# Patient Record
Sex: Male | Born: 1935 | Race: White | Hispanic: No | Marital: Married | Smoking: Never smoker
Health system: Southern US, Community
[De-identification: ages and names within clinical notes are randomized; demographics above are authoritative.]

---

## 2018-02-20 ENCOUNTER — Ambulatory Visit (INDEPENDENT_AMBULATORY_CARE_PROVIDER_SITE_OTHER): Payer: Non-veteran care | Admitting: Internal Medicine

## 2018-02-20 ENCOUNTER — Other Ambulatory Visit (INDEPENDENT_AMBULATORY_CARE_PROVIDER_SITE_OTHER): Payer: Non-veteran care

## 2018-02-20 ENCOUNTER — Encounter: Payer: Self-pay | Admitting: Internal Medicine

## 2018-02-20 VITALS — BP 112/78 | HR 106 | Ht 67.0 in | Wt 129.0 lb

## 2018-02-20 DIAGNOSIS — J849 Interstitial pulmonary disease, unspecified: Secondary | ICD-10-CM | POA: Diagnosis not present

## 2018-02-20 LAB — SEDIMENTATION RATE: Sed Rate: 57 mm/hr — ABNORMAL HIGH (ref 0–20)

## 2018-02-20 NOTE — Progress Notes (Signed)
Subjective:    Patient ID: [name], male    DOB: May 03, 1936, 82 y.o.   MRN: [id number]  [facility]   HPI  IOV 02/20/2018  Chief Complaint  Patient presents with  . Consult    Referred by Dr. Clare Charon at [facility].  Pt stated he had TB in the 60's and due to that his breathing has become worse. Pt has c/o SOB at any time, dry cough, and CP.    82 year old male.  Veteran.  Referred by [facility].  He has a background history 1964 he was stationed in [place] and diagnosed with pulmonary tuberculosis and he was then admitted to a sanatorium in the [place] where he was admitted for 3 years and treated with INH and streptomycin according to his history and had cure for tuberculosis.  Since then he is always had evidence of fibrosis in his lungs on chest x-rays according to his history and is had some baseline level of shortness of breath. But he is a non-smoker without any autoimmune disease.  Then in 2017 he says he was diagnosed with a respiratory infection.  Probably admitted.  After this he said increased shortness of breath which is been a progressive decline.  The same with the cough as well.  He says he has had 3 CT scans of the chest in 2018 and is been diagnosed with unclear lung disease and treated as infection with antibiotics and prednisone.  Finally a referral was made.  Review of the chart shows that he had a CT scan of the chest in 2019 January and this shows progressive interstitial lung disease according to the report.  Also according to the report there is a classic UIP pattern.  I do not have these films with me.  Patient also tells me in the last several weeks he has lost 30 pounds and lost a lot of muscle mass.  He is extremely frustrated by this.  American College of chest physicians interstitial lung disease questionnaire is  Symptoms of cough and shortness of breath since 1964 with a turn for the worse since 2017.  Cough does wake him up at night.  It is  a dry cough.  He gets short of breath walking 100 feet after a few minutes on level ground.  In terms of his past medical history he does have some dry mouth weight loss muscle loss dysphagia and a previous history of tuberculosis although there is no any autoimmune disease.  Personal exposure history: Has not smoked any drugs of abuse or some cigarettes of marijuana.  Family history of lung disease: Denies  Home exposure history: He does not have a humidifier or insomnia or hot tub or Jacuzzi or birds or water damage or mold.  Occupational history: Denies any farm work of Pharmacologist or Public librarian a Advertising account executive work or [employer].  Has never worked in a [place] a Teacher, music of foundry or railroad a Public librarian or smelting or Development worker, community  Other organic dust exposure history: Denies any bird exposure to feathers of insecticide of fertilizer or beryllium or cobalt or cheese or cotton or fabric etc.  Pulmonary toxicity history: Denies.  Specifically denies any amiodarone or radiation or nitrofurantoin or BCG   Walking desaturation test on 02/20/2018 185 feet x 3 laps on ROOM AIR:  did walk only 2 laps with CMA  walked very slowly and stopped at 2 laps. Did not desaturate. Rest pulse ox was 97%, final pulse ox was 91%. HR response 104/min at rest to 112/min at peak exertion. [name]  Did nto Desaturate < 88% . [name] yes did  Desaturated </= 3% points. [name] yes did get tachyardic     has no past medical history on file.   reports that  has never smoked. he has never used smokeless tobacco.   Not on File  Immunization History  Administered Date(s) Administered  . Influenza, High Dose Seasonal PF 10/10/2017    No family history on file.   Current Outpatient Medications:  .  benzonatate (TESSALON) 100 MG capsule, Take 100 mg by mouth  3 (three) times daily., Disp: , Rfl:  .  albuterol (PROAIR HFA) 108 (90 Base) MCG/ACT inhaler, Inhale into the lungs., Disp: , Rfl:  .  Dextromethorphan-guaiFENesin 10-100 MG/5ML SOLN, Take by mouth., Disp: , Rfl:  .  finasteride (PROSCAR) 5 MG tablet, Take by mouth., Disp: , Rfl:  .  losartan (COZAAR) 25 MG tablet, Take by mouth., Disp: , Rfl:  .  omeprazole (PRILOSEC) 20 MG capsule, Take by mouth., Disp: , Rfl:  .  tiotropium (SPIRIVA) 18 MCG inhalation capsule, Place into inhaler and inhale., Disp: , Rfl:     Review of Systems  Constitutional: Positive for unexpected weight change. Negative for fever.  HENT: Positive for sneezing and trouble swallowing. Negative for congestion, dental problem, ear pain, nosebleeds, postnasal drip, rhinorrhea, sinus pressure and sore throat.   Eyes: Negative for redness and itching.  Respiratory: Positive for cough, chest tightness, shortness of breath and wheezing.   Cardiovascular: Positive for palpitations. Negative for leg swelling.  Gastrointestinal: Positive for nausea. Negative for vomiting.  Genitourinary: Negative for dysuria.  Musculoskeletal: Negative for joint swelling.  Skin: Negative for rash.  Allergic/Immunologic: Negative.  Negative for environmental allergies, food allergies and immunocompromised state.  Neurological: Positive for headaches.  Hematological: Does not bruise/bleed easily.  Psychiatric/Behavioral: Negative for dysphoric mood. The patient is not nervous/anxious.        Objective:   Physical Exam  Constitutional: He is oriented to person, place, and time. No distress.  Lean cachectuc  HENT:  Head: Normocephalic and atraumatic.  Right Ear: External ear normal.  Left Ear: External ear normal.  Mouth/Throat: Oropharynx is clear and moist. No oropharyngeal exudate.  Coughs perioidically  Eyes: Conjunctivae and EOM are normal. Pupils are equal, round, and reactive to light. Right eye exhibits no discharge. Left eye  exhibits no discharge. No scleral icterus.  Neck: Normal range of motion. Neck supple. No JVD present. No tracheal deviation present. No thyromegaly present.  Cardiovascular: Normal rate, regular rhythm and intact distal pulses. Exam reveals no gallop and no friction rub.  No murmur heard. Pulmonary/Chest: Effort normal. No respiratory distress. He has wheezes. He has rales. He exhibits no tenderness.  Has LL crackle sand UL scattered ronchii  Abdominal: Soft. Bowel sounds are normal. He exhibits no distension and no mass. There is no tenderness. There is no rebound and no guarding.  Musculoskeletal: Normal range of motion. He exhibits no edema or tenderness.  Bilateral clubbing ++  Lymphadenopathy:    He has no cervical adenopathy.  Neurological: He is alert and oriented to person, place, and time. He has normal reflexes. No cranial nerve deficit. Coordination normal.  Skin: Skin is warm and dry. No rash noted. He is not diaphoretic. No erythema. No pallor.  Psychiatric:  He has a normal mood and affect. His behavior is normal. Judgment and thought content normal.  Nursing note and vitals reviewed.   Vitals:   02/20/18 1510  BP: 112/78  Pulse: (!) 106  SpO2: 94%  Weight: 129 lb (58.5 kg)  Height: _0  (1.702 m)    Estimated body mass index is 20.2 kg/m as calculated from the following:   Height as of this encounter: _1  (1.702 m).   Weight as of this encounter: 129 lb (58.5 kg).       Assessment & Plan:     ICD-10-CM   1. ILD (interstitial lung disease) ([place]) J84.9       - I am concerned you have Interstitial Lung Disease (ILD) otherwise called pulmonary fibrosis (PF)  -  There are > 100 varieties of this - most likel.y IPF based on age, Vet, male, clubbing and description of UIP in outside radiology - To narrow down possibilities and assess severity please do the following tests  - do PFT breathing test   - Pre-bd spiro and dlco only. No lung volume or bd response. No  post-bd spiro  - do overnight oxygen test on room air  - sign release and get the CD Ro of  CT chest done at [facility]; best you do it yourself  - do autoimmune panel: Serum: ESR, ANA, DS-DNA, RF, anti-CCP, ssA, ssB, scl-70, ANCA, MPO and PR-3 antibodies, Total CK,  Aldolase,  Hypersensitivity Pneumonitis Panel  Followup - next 2 - 3 weeks to see Dr Chase Caller in ILD clinic; ok to see Dr Vaughan Browner in ILD clinic as well     Dr. Brand Males, M.D., [facility].C.P Pulmonary and Critical Care Medicine Staff Physician, [place] Director - Interstitial Lung Disease  Program  Pulmonary [place] at [place], [place], [address]  Pager: [phone], If no answer or between  15:00h - 7:00h: call [phone] Telephone: [phone]

## 2018-02-20 NOTE — Patient Instructions (Signed)
ICD-10-CM   1. ILD (interstitial lung disease) ([place]) J84.9     - I am concerned you have Interstitial Lung Disease (ILD) otherwise called pulmonary fibrosis (PF)  -  There are > 100 varieties of this - To narrow down possibilities and assess severity please do the following tests  - do PFT breathing test   - Pre-bd spiro and dlco only. No lung volume or bd response. No post-bd spiro  - do overnight oxygen test on room air  - sign release and get the CD Ro of  CT chest done at [facility]; best you do it yourself  - do autoimmune panel: Serum: ESR, ANA, DS-DNA, RF, anti-CCP, ssA, ssB, scl-70, ANCA, MPO and PR-3 antibodies, Total CK,  Aldolase,  Hypersensitivity Pneumonitis Panel  Followup - next 2 - 3 weeks to see Dr Chase Caller in ILD clinic; ok to see Dr Vaughan Browner in ILD clinic as well

## 2018-02-20 NOTE — Addendum Note (Signed)
Addended by: Pilar GrammesEYNOLDS, Kwamane Whack C on: 02/20/2018 05:03 PM   Modules accepted: Orders

## 2018-02-21 LAB — CREATININE KINASE MB: CK MB: 5.8 ng/mL — AB (ref 0.3–4.0)

## 2018-02-25 LAB — HYPERSENSITIVITY PNUEMONITIS PROFILE
ASPERGILLUS FUMIGATUS: NEGATIVE
Faenia retivirgula: NEGATIVE
Pigeon Serum: POSITIVE — AB
S. VIRIDIS: NEGATIVE
T. CANDIDUS: NEGATIVE
T. VULGARIS: NEGATIVE

## 2018-02-25 LAB — ANTI-DNA ANTIBODY, DOUBLE-STRANDED: ds DNA Ab: 1 IU/mL

## 2018-02-25 LAB — CYCLIC CITRUL PEPTIDE ANTIBODY, IGG: Cyclic Citrullin Peptide Ab: <16 UNITS

## 2018-02-25 LAB — ANCA SCREEN W REFLEX TITER: ANCA Screen: NEGATIVE

## 2018-02-25 LAB — RHEUMATOID FACTOR

## 2018-02-25 LAB — SJOGRENS SYNDROME-A EXTRACTABLE NUCLEAR ANTIBODY: SSA (RO) (ENA) ANTIBODY, IGG: NEGATIVE AI

## 2018-02-25 LAB — MPO/PR-3 (ANCA) ANTIBODIES
Myeloperoxidase Abs: <1 AI
Serine Protease 3: <1 AI

## 2018-02-25 LAB — ANTI-SCLERODERMA ANTIBODY: Scleroderma (Scl-70) (ENA) Antibody, IgG: <1 AI

## 2018-02-25 LAB — ALDOLASE: ALDOLASE: 6.6 U/L (ref <=8.1)

## 2018-02-25 LAB — SJOGRENS SYNDROME-B EXTRACTABLE NUCLEAR ANTIBODY: SSB (La) (ENA) Antibody, IgG: 2.6 AI — AB

## 2018-02-25 LAB — ANA: ANA: NEGATIVE

## 2018-02-26 ENCOUNTER — Telehealth: Payer: Self-pay | Admitting: Internal Medicine

## 2018-02-26 NOTE — Progress Notes (Signed)
LMTCB x1 on preferred phone number listed for patient.  

## 2018-02-26 NOTE — Telephone Encounter (Signed)
LEt [name] DarnerJohn Morris know that autoimmune panel negative but pigeon antibody positive  1. Deifintiely needs to bring CD Rom from outside and we need to upload it 2. Did he have pigeons in past?  Dr. Kalman ShanMurali Chancellor Vanderloop, M.D., [facility]C.P Pulmonary and Critical Care Medicine Staff Physician, [facility] Director - Interstitial Lung Disease  Program  Pulmonary Fibrosis [facility] at [facility] [place], [place], [id number]  Pager: [phone], If no answer or between  15:00h - 7:00h: call [phone] Telephone: [phone]

## 2018-02-27 NOTE — Telephone Encounter (Signed)
Pt is calling back [phone] 

## 2018-02-28 NOTE — Telephone Encounter (Signed)
[facility], it's in Dr. Jane Canaryamaswamy's folder

## 2018-03-02 NOTE — Telephone Encounter (Signed)
Per Irving BurtonEmily- disc has been given to MR for review.

## 2018-03-02 NOTE — Telephone Encounter (Signed)
Spoke with the pt and notified of lab results He states that he has had pigeons in the past and also has a few now  I advised will let MR know  Note that he has ov with you with PFT's on 03/13/18

## 2018-03-02 NOTE — Telephone Encounter (Signed)
Pt aware to keep f/u Per msg below, Irving Burtonmily has given you the disc to review

## 2018-03-02 NOTE — Telephone Encounter (Signed)
lmtcb x1 to relay below results.

## 2018-03-02 NOTE — Telephone Encounter (Signed)
Let him know tha I want to discuss this in more detail at followup . This might be the reasn for fibrosis. He definitely needs to bring his CD ROM

## 2018-03-05 NOTE — Telephone Encounter (Signed)
Irving Burtonmily the disc is in red look at folder on my desk. Please keep it safe till date of visit

## 2018-03-07 NOTE — Telephone Encounter (Signed)
The disc is in MR's cubby on B-side. Will keep it there until pt's f/u visit with us.

## 2018-03-13 ENCOUNTER — Ambulatory Visit
Admission: RE | Admit: 2018-03-13 | Discharge: 2018-03-13 | Disposition: A | Discharge status: Home / Self Care | Payer: Self-pay | Source: Ambulatory Visit | Attending: Internal Medicine | Admitting: Internal Medicine

## 2018-03-13 ENCOUNTER — Ambulatory Visit (INDEPENDENT_AMBULATORY_CARE_PROVIDER_SITE_OTHER): Payer: Non-veteran care | Admitting: Internal Medicine

## 2018-03-13 ENCOUNTER — Encounter: Payer: Self-pay | Admitting: Internal Medicine

## 2018-03-13 VITALS — BP 112/68 | HR 93 | Ht 67.0 in | Wt 126.0 lb

## 2018-03-13 DIAGNOSIS — J849 Interstitial pulmonary disease, unspecified: Secondary | ICD-10-CM

## 2018-03-13 DIAGNOSIS — Z77120 Contact with and (suspected) exposure to mold (toxic): Secondary | ICD-10-CM | POA: Diagnosis not present

## 2018-03-13 LAB — PULMONARY FUNCTION TEST
DL/VA % pred: 53 %
DL/VA: 2.33 ml/min/mmHg/L
DLCO UNC % PRED: 34 %
DLCO unc: 9.79 ml/min/mmHg
FEF 25-75 Pre: 0.72 L/sec
FEF2575-%Pred-Pre: 42 %
FEV1-%Pred-Pre: 50 %
FEV1-PRE: 1.26 L
FEV1FVC-%Pred-Pre: 100 %
FEV6-%PRED-PRE: 52 %
FEV6-Pre: 1.71 L
FEV6FVC-%PRED-PRE: 106 %
FVC-%Pred-Pre: 48 %
FVC-Pre: 1.73 L
Pre FEV1/FVC ratio: 73 %
Pre FEV6/FVC Ratio: 99 %

## 2018-03-13 NOTE — Progress Notes (Signed)
PFT done today. 

## 2018-03-13 NOTE — Progress Notes (Signed)
Subjective:     Patient ID: [name], male   DOB: 16-Dec-1936, 82 y.o.   MRN: [id number]  HPI   [facility]   HPI  IOV 02/20/2018  Chief Complaint  Patient presents with  . Consult    [facility] Sel[facility].  Pt stated he had TB in the 60's and due to that his breathing has become worse. Pt has c/o SOB at any time, dry cough, and CP.    82 year old male.  Veteran.  [facility].  He has a background history 1964 he was stationed in [place] and diagnosed with pulmonary tuberculosis and he was then admitted [place] sanatorium [place] the [place] where he was admitted for 3 years and treated with INH and streptomycin according to his history and had cure for tuberculosis.  Since then he is always had evidence of fibrosis in his lungs on chest x-rays according to his history and is had some baseline level of shortness of breath. But he is a non-smoker without any autoimmune disease.  Then in 2017 he says he was diagnosed with a respiratory infection.  Probably admitted.  After this he said increased shortness of breath which is been a progressive decline.  The same with the cough as well.  He says he has had 3 CT scans of the chest in 2018 and is been diagnosed with unclear lung disease and treated as infection with antibiotics and prednisone.  Finally a referral was made.  Review of the chart shows that he had a CT scan of the chest in 2019 January and this shows progressive interstitial lung disease according to the report.  Also according to the report there is a classic UIP pattern.  I do not have these films with me.  Patient also tells me in the last several weeks he has lost 30 pounds and lost a lot of muscle mass.  He is extremely frustrated by this.  American College of chest physicians interstitial lung disease questionnaire is  Symptoms of cough and shortness of breath since 1964 with a turn for the worse since 2017.  Cough does wake him up at night.   It is a dry cough.  He gets short of breath walking 100 feet after a few minutes on level ground.  In terms of his past medical history he does have some dry mouth weight loss muscle loss dysphagia and a previous history of tuberculosis although there is no any autoimmune disease.  Personal exposure history: Has not smoked any drugs of abuse or some cigarettes of marijuana.  Family history of lung disease: Denies  Home exposure history: He does not have a humidifier or insomnia or hot tub or Jacuzzi or birds or water damage or mold.  Occupational history: Denies any farm work of Financial plannerpainting or sandblasting or pipefitting or automotive mechanic or welding or insulation or Transport plannerVineyard worker a Ambulance personcarpenter or laboratory work or Atmos Energylongshoreman.  Has never worked in a minor quarry of [employer]ulp mill a Theatre managerbakery of plastic factory of foundry or railroad a Tax inspectorpaper mill or smelting or Chemical engineertile construction  Other organic dust exposure history: Denies any bird exposure to feathers of insecticide of fertilizer or beryllium or cobalt or cheese or cotton or fabric etc.  Pulmonary toxicity history: Denies.  Specifically denies any amiodarone or radiation or nitrofurantoin or BCG     Walking desaturation test on 02/20/2018 185 feet x 3 laps on ROOM AIR:  did walk only  2 laps with CMA walked very slowly and stopped at 2 laps. Did not desaturate. Rest pulse ox was 97%, final pulse ox was 91%. HR response 104/min at rest to 112/min at peak exertion. [name]  Did nto Desaturate < 88% . [name] yes did  Desaturated </= 3% points. [name] yes did get tachyardic   OV 03/13/2018  Chief Complaint  Patient presents with  . Follow-up    PFT done today.  Pt states his breathing is about the same since last visit, becoming SOB very easily, has a dry cough, and has pain from right side of chest into his back and radiating down right arm.     FU to discuss teest results  Hx: now admits to mold expoisure at home. Lives  in trailer x 10 years. Has water seepage. Has mold in different room and on clothes and shotes. Got expiosed to piegon once in fall 2018 for 2 weeks when he went to a[place] to reduce bird exposure (work related). Denies other bird expoisreu. Very worried about weight loss. KBIL shows significant wrry ad symptoms - all level 1 - 2of 7 point scale    Results for [name] (MRN [id number]) as of 03/13/2018 13:36  Ref. Range 03/13/2018 12:21  FVC-Pre Latest Units: L 1.73  FVC-%Pred-Pre Latest Units: % 48  FEV1-Pre Latest Units: L 1.26  FEV1-%Pred-Pre Latest Units: % 50  Pre FEV1/FVC ratio Latest Units: % 73  FEV1FVC-%Pred-Pre Latest Units: % 100  Results for [name] (MRN [id number]) as of 03/13/2018 13:36  Ref. Range 03/13/2018 12:21  DLCO unc Latest Units: ml/min/mmHg 9.79  DLCO unc % pred Latest Units: % 34   Results for [name] (MRN [id number]) as of 03/13/2018 13:36  Ref. Range 02/20/2018 17:01 02/20/2018 17:05  CK-MB Latest Ref Range: 0.3 - 4.0 ng/mL  5.8 (H)  Aldolase Latest Ref Range: < OR = 8.1 U/L 6.6   Sed Rate Latest Ref Range: 0 - 20 mm/hr 57 (H)   ASPERGILLUS FUMIGATUS Latest Ref Range: NEGATIVE  NEGATIVE   Pigeon Serum Latest Ref Range: NEGATIVE  POSITIVE (A)   Anit Nuclear Antibody(ANA) Latest Ref Range: NEGATIVE  NEGATIVE   Cyclic Citrullin Peptide Ab Latest Units: UNITS <16   ds DNA Ab Latest Units: IU/mL 1   Myeloperoxidase Abs Latest Units: AI <1.0   Serine Protease 3 Latest Units: AI <1.0   RA Latex Turbid. Latest Ref Range: <14 IU/mL <14   SSA (Ro) (ENA) Antibody, IgG Latest Ref Range: <1.0 NEG AI <1.0 NEG   SSB (La) (ENA) Antibody, IgG Latest Ref Range: <1.0 NEG AI 2.6 POS (A)   Scleroderma (Scl-70) (ENA) Antibody, IgG Latest Ref Range: <1.0 NEG AI <1.0 NEG        has no past medical history on file.   reports that he has never smoked. He has never used smokeless tobacco.  No past surgical history on file.  Not on File  Immunization History   Administered Date(s) Administered  . Influenza, High Dose Seasonal PF 10/10/2017    No family history on file.   Current Outpatient Medications:  .  albuterol (PROAIR HFA) 108 (90 Base) MCG/ACT inhaler, Inhale into the lungs., Disp: , Rfl:  .  benzonatate (TESSALON) 100 MG capsule, Take 100 mg by mouth 3 (three) times daily., Disp: , Rfl:  .  Dextromethorphan-guaiFENesin 10-100 MG/5ML SOLN, Take by mouth., Disp: , Rfl:  .  finasteride (PROSCAR) 5 MG tablet, Take by mouth., Disp: ,  Rfl:  .  losartan (COZAAR) 25 MG tablet, Take by mouth., Disp: , Rfl:  .  omeprazole (PRILOSEC) 20 MG capsule, Take by mouth., Disp: , Rfl:  .  tiotropium (SPIRIVA) 18 MCG inhalation capsule, Place into inhaler and inhale., Disp: , Rfl:    Review of Systems     Objective:   Physical Exam Vitals:   03/13/18 1322  BP: 112/68  Pulse: 93  SpO2: 92%  Weight: 126 lb (57.2 kg)  Height: 5\' 7"  (1.702 m)   Estimated body mass index is 19.73 kg/m as calculated from the following:   Height as of this encounter: 5\' 7"  (1.702 m).   Weight as of this encounter: 126 lb (57.2 kg).     Assessment:       ICD-10-CM   1. ILD (interstitial lung disease) (HCC) J84.9 CT OUTSIDE FILMS CHEST  2. 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 Benefits of diagnosis but limitations of non-diagnosis also explained. Patient  verbalized understanding and wished to proceed.    Followup - 2 weeks after bronch at ILD clinic  - if bronch  + thoracic rad CT read is still indeterminate then will get rheum aopinion and then if that too is indeterminate, consider surgical lung bx v clinical dx (lean BMI might be risk for bx)    > 50% of this > 25 min visit spent in face to face counseling or coordination of care

## 2018-03-13 NOTE — Patient Instructions (Addendum)
ICD-10-CM   1. ILD (interstitial lung disease) (HCC) J84.9 CT OUTSIDE FILMS CHEST  2. 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

## 2018-03-14 ENCOUNTER — Ambulatory Visit
Admission: RE | Admit: 2018-03-14 | Discharge: 2018-03-14 | Disposition: A | Discharge status: Home / Self Care | Payer: Self-pay | Source: Ambulatory Visit | Attending: Internal Medicine | Admitting: Internal Medicine

## 2018-03-14 ENCOUNTER — Other Ambulatory Visit: Payer: Self-pay | Admitting: *Deleted

## 2018-03-14 DIAGNOSIS — J849 Interstitial pulmonary disease, unspecified: Secondary | ICD-10-CM

## 2018-03-14 NOTE — Progress Notes (Signed)
Received a message from [place] at [facility] who stated she had received the disc of pt's images we wanted added into our system.  Per Misty StanleyStacey, she needed 5 outside CT chest orders and 1 outside cxr order.  Orders placed and [facility].  Nothing further needed at this time.

## 2018-03-15 ENCOUNTER — Telehealth: Payer: Self-pay | Admitting: Internal Medicine

## 2018-03-15 NOTE — Telephone Encounter (Signed)
Called and spoke with [place] [facility]. I have faxed office notes as well as the PFT results. Nothing further needed.

## 2018-03-21 ENCOUNTER — Telehealth: Payer: Self-pay | Admitting: Internal Medicine

## 2018-03-21 ENCOUNTER — Encounter (HOSPITAL_COMMUNITY): Admission: RE | Payer: Self-pay | Source: Ambulatory Visit

## 2018-03-21 ENCOUNTER — Inpatient Hospital Stay (HOSPITAL_COMMUNITY)
Admission: RE | Admit: 2018-03-21 | Discharge: 2018-03-21 | Disposition: A | Discharge status: Home / Self Care | Payer: No Typology Code available for payment source | Source: Ambulatory Visit

## 2018-03-21 ENCOUNTER — Ambulatory Visit (HOSPITAL_COMMUNITY)
Admission: RE | Admit: 2018-03-21 | Payer: No Typology Code available for payment source | Source: Ambulatory Visit | Admitting: Internal Medicine

## 2018-03-21 SURGERY — VIDEO BRONCHOSCOPY WITHOUT FLUORO
Anesthesia: Moderate Sedation | Laterality: Bilateral

## 2018-03-21 NOTE — Telephone Encounter (Signed)
Called and spoke to patient to see if he would be able to reschedule the bronch for dates MR had suggested. Patient stated he won't have car fixed by next week and can't do the week of 4/22 either because of his wife's prior obligations. May need to push back to May.   Routing to MR as [employer]

## 2018-03-21 NOTE — Telephone Encounter (Signed)
[facility] 2pm onwards. I can also do tuesday4/9/19 at [place] after 2pm but I less prefer this date but both are options. If not will have to be week of 04/09/18

## 2018-03-21 NOTE — Telephone Encounter (Signed)
Called and spoke to patient. Patient stated [place] in [place] and his car has broken down. He stated he has a secondary vehicle but that it's stick shift and that his wife can't drive it. He called and asked [facility] if he can drive home after procedure and he was told he would need a ride. Patient stated he doesn't have transportation and now has a headache and feeling a little nauseous. Patient said he is sorry but that he needs to reschedule.   Routing to MR.

## 2018-04-10 ENCOUNTER — Ambulatory Visit: Payer: No Typology Code available for payment source | Admitting: Internal Medicine

## 2018-04-10 NOTE — Telephone Encounter (Signed)
Irving Burtonmily to coordinate - choose a date week of may 6 and may 13 when I do not have all day or PM clinic and should be 2pm [place] (avoid Monday and Friday to extent possible)

## 2018-04-11 NOTE — Telephone Encounter (Signed)
Called pt to see if he was ready to reschedule the bronch and pt stated to me his breathing has become worse and at this time does not want to reschedule the bronch due to being afraid he would not wake up if put under anesthesia.  Pt stated to me he is currently wearing O2 at night and states he is not wearing it during the day but the DME who brought his O2 to him stated that he needed to come to the office for an appt to see if he might need to wear O2 during the day due to his breathing becoming worse.  Scheduled an appt for pt to see MR Friday, 4/26 at 12pm.  Nothing further needed at this time.

## 2018-04-13 ENCOUNTER — Encounter: Payer: Self-pay | Admitting: Internal Medicine

## 2018-04-13 ENCOUNTER — Ambulatory Visit (INDEPENDENT_AMBULATORY_CARE_PROVIDER_SITE_OTHER): Payer: Non-veteran care | Admitting: Internal Medicine

## 2018-04-13 VITALS — BP 120/80 | HR 99 | Ht 67.0 in | Wt 123.6 lb

## 2018-04-13 DIAGNOSIS — J9611 Chronic respiratory failure with hypoxia: Secondary | ICD-10-CM | POA: Diagnosis not present

## 2018-04-13 DIAGNOSIS — J849 Interstitial pulmonary disease, unspecified: Secondary | ICD-10-CM

## 2018-04-13 MED ORDER — PREDNISONE 10 MG PO TABS: ORAL_TABLET | ORAL | 0 refills | Status: DC

## 2018-04-13 NOTE — Progress Notes (Signed)
Subjective:     Patient ID: [name], male   DOB: 03/08/1936, 82 y.o.   MRN: [id number]  HPI   [facility]   [facility]PI  IOV 02/20/2018  Chief Complaint  Patient presents with  . Consult    Referred by [facility] Selinda Morris at [facility].  Pt stated he had TB in the 60's and due to that his breathing has become worse. Pt has c/o SOB at any time, dry cough, and CP.    82 year old male.  Veteran.  Referred by [facility].  He has a background history 1964 he was stationed in [place] and diagnosed with pulmonary tuberculosis and he was then admitted [place] san[place] the [place] where he was admitted for 3 years and treated with INH and streptomycin according to his history and had cure for tuberculosis.  Since then he is always had evidence of fibrosis in his lungs on chest x-rays according to his history and is had some baseline level of shortness of breath. But he is a non-smoker without any autoimmune disease.  Then in 2017 he says he was diagnosed with a respiratory infection.  Probably admitted.  After this he said increased shortness of breath which is been a progressive decline.  The same with the cough as well.  He says he has had 3 CT scans of the chest in 2018 and is been diagnosed with unclear lung disease and treated as infection with antibiotics and prednisone.  Finally a referral was made.  Review of the chart shows that he had a CT scan of the chest in 2019 January and this shows progressive interstitial lung disease according to the report.  Also according to the report there is a classic UIP pattern.  I do not have these films with me.  Patient also tells me in the last several weeks he has lost 30 pounds and lost a lot of muscle mass.  He is extremely frustrated by this.  American College of chest physicians interstitial lung disease questionnaire is  Symptoms of cough and shortness of breath since 1964 with a turn for the worse since 2017.  Cough does wake him up at night.   It is a dry cough.  He gets short of breath walking 100 feet after a few minutes on level ground.  In terms of his past medical history he does have some dry mouth weight loss muscle loss dysphagia and a previous history of tuberculosis although there is no any autoimmune disease.  Personal exposure history: Has not smoked any drugs of abuse or some cigarettes of marijuana.  Family history of lung disease: Denies  Home exposure history: He does not have a humidifier or insomnia or hot tub or Jacuzzi or birds or water damage or mold.  Occupational history: Denies any farm work of Financial plannerpainting or sandblasting or pipefitting or automotive mechanic or welding or insulation or Transport plannerVineyard worker a Ambulance personcarpenter or laboratory work or Atmos Energylongshoreman.  Has never worked in a minor quarry of [employer] mill a Theatre managerbakery of plastic factory of foundry or railroad a Tax inspectorpaper mill or smelting or Chemical engineertile construction  Other organic dust exposure history: Denies any bird exposure to feathers of insecticide of fertilizer or beryllium or cobalt or cheese or cotton or fabric etc.  Pulmonary toxicity history: Denies.  Specifically denies any amiodarone or radiation or nitrofurantoin or BCG     Walking desaturation test on 02/20/2018 185 feet x 3 laps on ROOM AIR:  did walk only  2 laps with CMA walked very slowly and stopped at 2 laps. Did not desaturate. Rest pulse ox was 97%, final pulse ox was 91%. HR response 104/min at rest to 112/min at peak exertion. [name]  Did nto Desaturate < 88% . [name] yes did  Desaturated </= 3% points. [name] yes did get tachyardic   OV 03/13/2018  Chief Complaint  Patient presents with  . Follow-up    PFT done today.  Pt states his breathing is about the same since last visit, becoming SOB very easily, has a dry cough, and has pain from right side of chest into his back and radiating down right arm.     FU to discuss teest results  Hx: now admits to mold expoisure at home. Lives  in trailer x 10 years. Has water seepage. Has mold in different room and on clothes and shotes. Got expiosed to piegon once in fall 2018 for 2 weeks when he [place] to reduce bird exposure (work related). Denies other bird expoisreu. Very worried about weight loss. KBIL shows significant wrry ad symptoms - all level 1 - 2of 7 point scale       Results for [name], [name] (MRN [id number]) as of 03/13/2018 13:36  Ref. Range 03/13/2018 12:21  FVC-Pre Latest Units: L 1.73  FVC-%Pred-Pre Latest Units: % 48  FEV1-Pre Latest Units: L 1.26  FEV1-%Pred-Pre Latest Units: % 50  Pre FEV1/FVC ratio Latest Units: % 73  FEV1FVC-%Pred-Pre Latest Units: % 100  Results for [name], [name] (MRN [id number]) as of 03/13/2018 13:36  Ref. Range 03/13/2018 12:21  DLCO unc Latest Units: ml/min/mmHg 9.79  DLCO unc % pred Latest Units: % 34   Results for [name], [name] (MRN [id number]) as of 03/13/2018 13:36  Ref. Range 02/20/2018 17:01 02/20/2018 17:05  CK-MB Latest Ref Range: 0.3 - 4.0 ng/mL  5.8 (H)  Aldolase Latest Ref Range: < OR = 8.1 U/L 6.6   Sed Rate Latest Ref Range: 0 - 20 mm/hr 57 (H)   ASPERGILLUS FUMIGATUS Latest Ref Range: NEGATIVE  NEGATIVE   Pigeon Serum Latest Ref Range: NEGATIVE  POSITIVE (A)   Anit Nuclear Antibody(ANA) Latest Ref Range: NEGATIVE  NEGATIVE   Cyclic Citrullin Peptide Ab Latest Units: UNITS <16   ds DNA Ab Latest Units: IU/mL 1   Myeloperoxidase Abs Latest Units: AI <1.0   Serine Protease 3 Latest Units: AI <1.0   RA Latex Turbid. Latest Ref Range: <14 IU/mL <14   SSA (Ro) (ENA) Antibody, IgG Latest Ref Range: <1.0 NEG AI <1.0 NEG   SSB (La) (ENA) Antibody, IgG Latest Ref Range: <1.0 NEG AI 2.6 POS (A)   Scleroderma (Scl-70) (ENA) Antibody, IgG Latest Ref Range: <1.0 NEG AI <1.0 NEG      OV 04/13/2018  Chief Complaint  Patient presents with  . Acute Visit    Pt states he has had worsening breathing and has a burning sensation in rib cage on right side and has pain in abdomen  muscles. Pt also has a dry cough.   Follow-up interstitial lung disease/pulmonary fibrosis differential diagnosis of UIP/IPF versus chronic fibrotichypersensitivity pneumonitis: Mold exposure Was supposed to do a bronchoalveolar lavage to distant vision between the above etiologies but his car broke down and he could not do it but then he made this acute visit saying that the last 2 weeks his increasing shortness of breath and cough without any sputum. There is no orthopnea paroxysmal nocturnal dyspnea or edema or fever. He  is also lost weight and see last saw me. Apparently he has lost 20 or 30 pounds. It seems that is progressive respirator symptoms are correlating with this weight loss. He feels prednisone might help him. Today when we walked him he desaturated and this is a change from before.   Walking desaturation test on 04/13/2018 185 feet x 3 laps on ROOM AIR:  did YES  desaturate. Rest pulse ox was 97%, final pulse ox was 87%. HR response 99/min at rest to 112/min at peak exertion. [name]  Yes did Desaturate < 88% . [name] yes did  Desaturated </= 3% points. [name] yes get tachyardic. Neeeded 2L [place] to correct       has no past medical history on file.   reports that he has never smoked. He has never used smokeless tobacco.  No past surgical history on file.  No Known Allergies  Immunization History  Administered Date(s) Administered  . Influenza, High Dose Seasonal PF 10/10/2017    No family history on file.   Current Outpatient Medications:  .  albuterol (PROAIR HFA) 108 (90 Base) MCG/ACT inhaler, Inhale 2 puffs into the lungs every 4 (four) hours as needed (FOR WHEEZING/SHORTNESS OF BREATH). , Disp: , Rfl:  .  albuterol (PROVENTIL) (2.5 MG/3ML) 0.083% nebulizer solution, Take 2.5 mg by nebulization every 6 (six) hours as needed for wheezing or shortness of breath., Disp: , Rfl:  .  benzonatate (TESSALON) 100 MG capsule, Take 100 mg by mouth 3  (three) times daily., Disp: , Rfl:  .  Bioflavonoid Products (VITAMIN C) CHEW, Chew 2 tablets by mouth daily., Disp: , Rfl:  .  finasteride (PROSCAR) 5 MG tablet, Take 5 mg by mouth daily. , Disp: , Rfl:  .  guaifenesin (HUMIBID E) 400 MG TABS tablet, Take 400 mg by mouth 3 (three) times daily., Disp: , Rfl:  .  ibuprofen (ADVIL,MOTRIN) 200 MG tablet, Take 400 mg by mouth every 8 (eight) hours as needed (for pain.)., Disp: , Rfl:  .  losartan (COZAAR) 100 MG tablet, Take 50 mg by mouth daily., Disp: , Rfl:  .  Methylsulfonylmethane (MSM) 1000 MG CAPS, Take 1,000 mg by mouth daily., Disp: , Rfl:  .  Multiple Vitamin (MULTIVITAMIN WITH MINERALS) TABS tablet, Take 1 tablet by mouth daily. CELLULAR ENERGY IMMUNE SUPPORT & MUSCULAR SUPPORT MULTIVITAMIN, Disp: , Rfl:  .  omeprazole (PRILOSEC) 20 MG capsule, Take 20 mg by mouth 3 (three) times daily before meals. , Disp: , Rfl:  .  tiotropium (SPIRIVA) 18 MCG inhalation capsule, Place 18 mcg into inhaler and inhale 2 (two) times daily as needed (SCHEDULED EACH MORNING). , Disp: , Rfl:     Review of Systems     Objective:   Physical Exam Today's Vitals   04/13/18 1139  BP: 120/80  Pulse: 99  SpO2: 97%  Weight: 123 lb 9.6 oz (56.1 kg)  Height: 5\' 7"  (1.702 m)    Estimated body mass index is 19.36 kg/m as calculated from the following:   Height as of this encounter: 5\' 7"  (1.702 m).   Weight as of this encounter: 123 lb 9.6 oz (56.1 kg).     Assessment:       ICD-10-CM   1. ILD (interstitial lung disease) (HCC) J84.9 Ambulatory Referral for DME  2. Chronic hypoxemic respiratory failure (HCC) J96.11        Plan:      Progressive fibrosis of lung This is either chronic HP  or IPF progressive Getting worse fast   Plan - try prednisone for flare v chronic HP mamangement -   -he Is too frail and lsot lot of weight to tolerate Ricki Rodriguez Durel Salts  - Please take Take prednisone 60mg  daily x 3 days, then 40mg  once daily x 3 days, then 30mg   once daily x 3 days, then 20mg  once daily x 3 days, then prednisone 10mg  once daily  x 3 days and then 5mg  daily to continue - might need higher doses depending on his feedback at folowup - continue o2 at night  - start o2 2LNC with exertion  Followup  - 30 min time slot at followup - next 2 weeks with Dr Marchelle Gearing or an APP  - walk test on room air (not ) at followup  - will need to discuss goals of care at followup   Dr. Kalman Shan, M.D., [facility].C.P Pulmonary and Critical Care Medicine Staff Physician, [facility] Center Director - Interstitial Lung Disease  Program  Pulmonary Fibrosis [facility] at [facility] [place], [place], [address]  Pager: [phone], If no answer or between  15:00h - 7:00h: call [phone] Telephone: [phone]

## 2018-04-13 NOTE — Patient Instructions (Signed)
ICD-10-CM   1. ILD (interstitial lung disease) (HCC) J84.9 Ambulatory Referral for DME  2. Chronic hypoxemic respiratory failure (HCC) J96.11      Progressive fibrosis of lung This is either chronic HP or IPF progressive Getting worse fast  Plan  - Please take Take prednisone 60mg  daily x 3 days, then 40mg  once daily x 3 days, then 30mg  once daily x 3 days, then 20mg  once daily x 3 days, then prednisone 10mg  once daily  x 3 days and then 5mg  daily to continue - continue o2 at night  - start o2 2LNC with exertion  Followup  - 30 min time slot at followup - next 2 weeks with Dr Marchelle Gearingamaswamy or an APP  - walk test on room air (not 6mwd) at followup

## 2018-04-26 ENCOUNTER — Telehealth: Payer: Self-pay | Admitting: Internal Medicine

## 2018-04-26 NOTE — Telephone Encounter (Signed)
Spoke with pt, he states he cannot send Rx through the regular pharmacy. I [facility]. Will hold in triage.

## 2018-04-26 NOTE — Telephone Encounter (Signed)
Spoke with pt, he states he never received the prednisone Rx for Prednisone. All Rx's have to go through the [place]. MR can we send this Rx in? I wanted to make sure because it has been over a month ago. Please advise.    Patient Instructions by Kalman Shan, MD at 04/13/2018 12:00 PM  Author: Kalman Shan, MD Author Type: Physician Filed: 04/13/2018 12:32 PM  Note Status: Signed Cosign: Cosign Not Required Encounter Date: 04/13/2018  Editor: Kalman Shan, MD (Physician)      ICD-10-CM   1. ILD (interstitial lung disease) (HCC) J84.9 Ambulatory Referral for DME  2. Chronic hypoxemic respiratory failure (HCC) J96.11      Progressive fibrosis of lung This is either chronic HP or IPF progressive Getting worse fast  Plan  - Please take Take prednisone  daily x 3 days, then  once daily x 3 days, then  once daily x 3 days, then  once daily x 3 days, then prednisone  once daily  x 3 days and then  daily to continue - continue o2 at night  - start o2 2LNC with exertion  Followup  - 30 min time slot at followup - next 2 weeks with Dr Marchelle Gearing or an APP             - walk test on room air (not ) at followup

## 2018-04-26 NOTE — Telephone Encounter (Signed)
Yeah just send it into [facility] and he can pick it there. Is cheap. And no need to delay Rx   Dr. Kalman Shan, M.D., [facility].C.P Pulmonary and Critical Care Medicine Staff Physician, [facility] Center Director - Interstitial Lung Disease  Program  Pulmonary Fibrosis [facility] Network at [facility] [place], [place], [address]  Pager: [phone], If no answer or between  15:00h - 7:00h: call [phone] Telephone: [phone]

## 2018-04-27 MED ORDER — PREDNISONE 10 MG PO TABS: ORAL_TABLET | ORAL | 0 refills | Status: DC

## 2018-04-27 NOTE — Telephone Encounter (Signed)
Called the [facility] customer service [phone] and received the fax number to [place], [place] at [phone]/ Called pt to let him know and nothing further is needed.

## 2018-05-01 ENCOUNTER — Telehealth: Payer: Self-pay | Admitting: Internal Medicine

## 2018-05-01 ENCOUNTER — Ambulatory Visit (INDEPENDENT_AMBULATORY_CARE_PROVIDER_SITE_OTHER): Payer: Non-veteran care | Admitting: Internal Medicine

## 2018-05-01 ENCOUNTER — Encounter: Payer: Self-pay | Admitting: Internal Medicine

## 2018-05-01 VITALS — BP 116/74 | HR 109 | Ht 67.0 in | Wt 124.8 lb

## 2018-05-01 DIAGNOSIS — Z77120 Contact with and (suspected) exposure to mold (toxic): Secondary | ICD-10-CM | POA: Diagnosis not present

## 2018-05-01 DIAGNOSIS — J679 Hypersensitivity pneumonitis due to unspecified organic dust: Secondary | ICD-10-CM

## 2018-05-01 DIAGNOSIS — J849 Interstitial pulmonary disease, unspecified: Secondary | ICD-10-CM

## 2018-05-01 MED ORDER — PREDNISONE 10 MG PO TABS: 10.0000 mg | ORAL_TABLET | Freq: Every day | ORAL | 0 refills | Status: DC

## 2018-05-01 NOTE — Progress Notes (Signed)
Subjective:     Patient ID: [name], male   DOB: June 05, 1936, 82 y.o.   MRN: [id number]  HPI   [facility]   [facility]PI  IOV 02/20/2018  Chief Complaint  Patient presents with  . Consult    Referred by Dr. Selinda Morris at [facility].  Pt stated he had TB in the 60's and due to that his breathing has become worse. Pt has c/o SOB at any time, dry cough, and CP.    82 year old male.  Veteran.  Referred by [facility].  He has a background history 1964 he was stationed in [place] and diagnosed with pulmonary tuberculosis and he was then admitted to a sanatorium in the [place] where he was admitted for 3 years and treated with INH and streptomycin according to his history and had cure for tuberculosis.  Since then he is always had evidence of fibrosis in his lungs on chest x-rays according to his history and is had some baseline level of shortness of breath. But he is a non-smoker without any autoimmune disease.  Then in 2017 he says he was diagnosed with a respiratory infection.  Probably admitted.  After this he said increased shortness of breath which is been a progressive decline.  The same with the cough as well.  He says he has had 3 CT scans of the chest in 2018 and is been diagnosed with unclear lung disease and treated as infection with antibiotics and prednisone.  Finally a referral was made.  Review of the chart shows that he had a CT scan of the chest in 2019 January and this shows progressive interstitial lung disease according to the report.  Also according to the report there is a classic UIP pattern.  I do not have these films with me.  Patient also tells me in the last several weeks he has lost 30 pounds and lost a lot of muscle mass.  He is extremely frustrated by this.  American College of chest physicians interstitial lung disease questionnaire is  Symptoms of cough and shortness of breath since 1964 with a turn for the worse since 2017.  Cough does wake him up at night.   It is a dry cough.  He gets short of breath walking 100 feet after a few minutes on level ground.  In terms of his past medical history he does have some dry mouth weight loss muscle loss dysphagia and a previous history of tuberculosis although there is no any autoimmune disease.  Personal exposure history: Has not smoked any drugs of abuse or some cigarettes of marijuana.  Family history of lung disease: Denies  Home exposure history: He does not have a humidifier or insomnia or hot tub or Jacuzzi or birds or water damage or mold.  Occupational history: Denies any farm work of Financial planner or Transport planner a Ambulance person work or [employer].  Has never worked in a minor quarry of [employer] a Theatre manager of foundry or railroad a Tax inspector or smelting or Chemical engineer  Other organic dust exposure history: Denies any bird exposure to feathers of insecticide of fertilizer or beryllium or cobalt or cheese or cotton or fabric etc.  Pulmonary toxicity history: Denies.  Specifically denies any amiodarone or radiation or nitrofurantoin or BCG     Walking desaturation test on 02/20/2018 185 feet x 3 laps on ROOM AIR:  did walk only  2 laps with CMA walked very slowly and stopped at 2 laps. Did not desaturate. Rest pulse ox was 97%, final pulse ox was 91%. HR response 104/min at rest to 112/min at peak exertion. [name]  Did nto Desaturate < 88% . [name] yes did  Desaturated </= 3% points. [name] yes did get tachyardic   OV 03/13/2018  Chief Complaint  Patient presents with  . Follow-up    PFT done today.  Pt states his breathing is about the same since last visit, becoming SOB very easily, has a dry cough, and has pain from right side of chest into his back and radiating down right arm.     FU to discuss teest results  Hx: now admits to mold expoisure at home. Lives  in trailer x 10 years. Has water seepage. Has mold in different room and on clothes and shotes. Got expiosed to piegon once in fall 2018 for 2 weeks when he went to [place] to reduce bird exposure (work related). Denies other bird expoisreu. Very worried about weight loss. KBIL shows significant wrry ad symptoms - all level 1 - 2of 7 point scale       Results for [name] (MRN [id number]) as of 03/13/2018 13:36  Ref. Range 03/13/2018 12:21  FVC-Pre Latest Units: L 1.73  FVC-%Pred-Pre Latest Units: % 48  FEV1-Pre Latest Units: L 1.26  FEV1-%Pred-Pre Latest Units: % 50  Pre FEV1/FVC ratio Latest Units: % 73  FEV1FVC-%Pred-Pre Latest Units: % 100  Results for [name] (MRN [id number]) as of 03/13/2018 13:36  Ref. Range 03/13/2018 12:21  DLCO unc Latest Units: ml/min/mmHg 9.79  DLCO unc % pred Latest Units: % 34   Results for [name] (MRN [id number]) as of 03/13/2018 13:36  Ref. Range 02/20/2018 17:01 02/20/2018 17:05  CK-MB Latest Ref Range: 0.3 - 4.0 ng/mL  5.8 (H)  Aldolase Latest Ref Range: < OR = 8.1 U/L 6.6   Sed Rate Latest Ref Range: 0 - 20 mm/hr 57 (H)   ASPERGILLUS FUMIGATUS Latest Ref Range: NEGATIVE  NEGATIVE   Pigeon Serum Latest Ref Range: NEGATIVE  POSITIVE (A)   Anit Nuclear Antibody(ANA) Latest Ref Range: NEGATIVE  NEGATIVE   Cyclic Citrullin Peptide Ab Latest Units: UNITS <16   ds DNA Ab Latest Units: IU/mL 1   Myeloperoxidase Abs Latest Units: AI <1.0   Serine Protease 3 Latest Units: AI <1.0   RA Latex Turbid. Latest Ref Range: <14 IU/mL <14   SSA (Ro) (ENA) Antibody, IgG Latest Ref Range: <1.0 NEG AI <1.0 NEG   SSB (La) (ENA) Antibody, IgG Latest Ref Range: <1.0 NEG AI 2.6 POS (A)   Scleroderma (Scl-70) (ENA) Antibody, IgG Latest Ref Range: <1.0 NEG AI <1.0 NEG      OV 04/13/2018  Chief Complaint  Patient presents with  . Acute Visit    Pt states he has had worsening breathing and has a burning sensation in rib cage on right side and has pain in abdomen  muscles. Pt also has a dry cough.   Follow-up interstitial lung disease/pulmonary fibrosis differential diagnosis of UIP/IPF versus chronic fibrotichypersensitivity pneumonitis: Mold exposure He was supposed to do a bronchoalveolar lavaget ddx n between the above etiologies but his car broke down and he could not do it but then he made this acute visit saying that the last 2 weeks his increasing shortness of breath and cough without any sputum. There is no orthopnea paroxysmal nocturnal dyspnea or edema or fever. He  is also lost weight and see last saw me. Apparently he has lost 20 or 30 pounds. It seems that is progressive respirator symptoms are correlating with this weight loss. He feels prednisone might help him. Today when we walked him he desaturated and this is a change from before.   Walking desaturation test on 04/13/2018 185 feet x 3 laps on ROOM AIR:  did YES  desaturate. Rest pulse ox was 97%, final pulse ox was 87%. HR response 99/min at rest to 112/min at peak exertion. [name]  Yes did Desaturate < 88% . [name] yes did  Desaturated </= 3% points. [name] yes get tachyardic. Neeeded 2L [place] to correct    OV 05/01/2018  Chief Complaint  Patient presents with  . Follow-up    Pt states he is currently on prednisone and that has helped with his breathing. Pt is on O2 during the day and also at night.    Follow-up interstitial lung disease with differential diagnosis of IPF versus chronic fibrotic hypersensitivity pneumonitis with significant mold exposure  This is a quick follow-up to see how he is doing with prednisone.  However because he is a [place] patient and we did not realize that his prescriptions need to go to the [place] and there was a delay with this he did not get his prednisone till 2 days ago.  He is currently has finished 2 days of 60 mg prednisone and is feeling to feel significantly better.  In fact walking desaturation test today even though he desaturated  to was 89% he did not go as low as previously.  Also his weight loss is stabilized he is now down at 117 pounds and stable there.  [facility] till [facility], 2018 to be seen in the ILD clinic or outside the [place] [facility].  He is asking if he needs extended follow-up in the form of fibrosis program which I believe he definitely needs given the severity of his lung disease his weight loss and the specialist care this needs.  Simple office walk 185 feet x  3 laps goal with forehead probe 04/13/18 05/01/2018   O2 used Room air Room air  Number laps completed 3 3  Comments about pace x Good pace  Resting Pulse Ox/HR 97% and 99/min 96%/104  Final Pulse Ox/HR 87% and 112/min 89%/116  Desaturated </= 88% yes no  Desaturated <= 3% points yes yes  Got Tachycardic >/= 90/min yes yes  Symptoms at end of test dyspnea Mild dyspnea  Miscellaneous comments Needed 2LNC to correct Mild dyspnea        has no past medical history on file.   reports that he has never smoked. He has never used smokeless tobacco.  No past surgical history on file.  Allergies  Allergen Reactions  . Latex Rash    Immunization History  Administered Date(s) Administered  . Influenza, High Dose Seasonal PF 10/10/2017    No family history on file.   Current Outpatient Medications:  .  albuterol (PROAIR HFA) 108 (90 Base) MCG/ACT inhaler, Inhale 2 puffs into the lungs every 4 (four) hours as needed (FOR WHEEZING/SHORTNESS OF BREATH). , Disp: , Rfl:  .  albuterol (PROVENTIL) (2.5 MG/3ML) 0.083% nebulizer solution, Take 2.5 mg by nebulization every 6 (six) hours as needed for wheezing or shortness of breath., Disp: , Rfl:  .  benzonatate (TESSALON) 100 MG capsule, Take 100 mg by mouth 3 (three)  times daily., Disp: , Rfl:  .  Bioflavonoid Products (VITAMIN C) CHEW, Chew 2 tablets by mouth daily., Disp: , Rfl:  .  finasteride (PROSCAR) 5 MG tablet, Take 5 mg by mouth daily. , Disp: , Rfl:  .   guaifenesin (HUMIBID E) 400 MG TABS tablet, Take 400 mg by mouth 3 (three) times daily., Disp: , Rfl:  .  ibuprofen (ADVIL,MOTRIN) 200 MG tablet, Take 400 mg by mouth every 8 (eight) hours as needed (for pain.)., Disp: , Rfl:  .  losartan (COZAAR) 100 MG tablet, Take 50 mg by mouth daily., Disp: , Rfl:  .  Methylsulfonylmethane (MSM) 1000 MG CAPS, Take 1,000 mg by mouth daily., Disp: , Rfl:  .  Multiple Vitamin (MULTIVITAMIN WITH MINERALS) TABS tablet, Take 1 tablet by mouth daily. CELLULAR ENERGY IMMUNE SUPPORT & MUSCULAR SUPPORT MULTIVITAMIN, Disp: , Rfl:  .  omeprazole (PRILOSEC) 20 MG capsule, Take 20 mg by mouth 3 (three) times daily before meals. , Disp: , Rfl:  .  predniSONE (DELTASONE) 10 MG tablet, 60mg x3days,40mg x3days,30mg x3days,20mg x3days,10mg x3days,then 5mg  daily., Disp: 90 tablet, Rfl: 0 .  tiotropium (SPIRIVA) 18 MCG inhalation capsule, Place 18 mcg into inhaler and inhale 2 (two) times daily as needed (SCHEDULED EACH MORNING). , Disp: , Rfl:    Review of Systems     Objective:   Physical Exam  Constitutional: He is oriented to person, place, and time. He appears well-developed and well-nourished. No distress.  HENT:  Head: Normocephalic and atraumatic.  Right Ear: External ear normal.  Left Ear: External ear normal.  Mouth/Throat: Oropharynx is clear and moist. No oropharyngeal exudate.  Eyes: Pupils are equal, round, and reactive to light. Conjunctivae and EOM are normal. Right eye exhibits no discharge. Left eye exhibits no discharge. No scleral icterus.  Neck: Normal range of motion. Neck supple. No JVD present. No tracheal deviation present. No thyromegaly present.  Cardiovascular: Normal rate, regular rhythm and intact distal pulses. Exam reveals no gallop and no friction rub.  No murmur heard. Pulmonary/Chest: Effort normal. No respiratory distress. He has no wheezes. He has rales. He exhibits no tenderness.  Abdominal: Soft. Bowel sounds are normal. He exhibits no  distension and no mass. There is no tenderness. There is no rebound and no guarding.  Musculoskeletal: Normal range of motion. He exhibits no edema or tenderness.  Lymphadenopathy:    He has no cervical adenopathy.  Neurological: He is alert and oriented to person, place, and time. He has normal reflexes. No cranial nerve deficit. Coordination normal.  Skin: Skin is warm and dry. No rash noted. He is not diaphoretic. No erythema. No pallor.  Psychiatric: He has a normal mood and affect. His behavior is normal. Judgment and thought content normal.  Nursing note and vitals reviewed.  Vitals:   05/01/18 1632  BP: 116/74  Pulse: (!) 109  SpO2: 96%  Weight: 124 lb 12.8 oz (56.6 kg)  Height: 5\' 7"  (1.702 m)    Estimated body mass index is 19.55 kg/m as calculated from the following:   Height as of this encounter: 5\' 7"  (1.702 m).   Weight as of this encounter: 124 lb 12.8 oz (56.6 kg).     Assessment:       ICD-10-CM   1. ILD (interstitial lung disease) (HCC) J84.9   2. Hypersensitivity pneumonitis (HCC) J67.9   3. Mold exposure Z77.120        Plan:       Progressive fibrosis of lung  This is either chronic HP  or IPF progressive - glad you are feeling better with some prednisone In balance I think think you have chronic HP (hypersensitivity pneumonitis)  Frustrating we did not understand that the prednisone needed to go through the [facility]  Plan  - please continue prednisone as outlined at last visit although when you come to  per day  Stay at  per day. Do not go down to  per day - continue o2 at night  - start o2 [facility] with exertion  Followup  - 6-8 weeks regular clinic; will do simple walk test at followup (not )  - I think you need to still come here for followup past 05/18/18 to ILD clinic for another 1 year. Please inform the [facility] this. We will also send our note. And can call if you give [place] the correct number to call     > 50% of this > 25 min visit  spent in face to face counseling or coordination of care    Dr. Kalman Shan, M.D., [facility].C.P Pulmonary and Critical Care Medicine Staff Physician, [facility] Center Director - Interstitial Lung Disease  Program  Pulmonary Fibrosis [facility] at [facility] [place], [place], [address]  Pager: [phone], If no answer or between  15:00h - 7:00h: call [phone] Telephone: [phone]

## 2018-05-01 NOTE — Telephone Encounter (Signed)
Pt's validity for him to be able to come to our office for appts runs out 05/18/18.  Per MR, pt still needs to come for appts for another year.  Pt is going to try to contact the [facility] to see if they will approve him to still come to our office for f/u appts.   If the [facility] needs [place] to contact the [place], the person that needs to be contacted is Geoffery Lyons, Science writer. Phone number is [phone] ext [phone].  If needed, pt's authorization number is [id number]  Routing to MR as an [place]

## 2018-05-01 NOTE — Patient Instructions (Addendum)
ICD-10-CM   1. ILD (interstitial lung disease) (HCC) J84.9   2. Hypersensitivity pneumonitis (HCC) J67.9   3. Mold exposure Z77.120      Progressive fibrosis of lung  This is either chronic HP or IPF progressive - glad you are feeling better with some prednisone In balance I think think you have chronic HP (hypersensitivity pneumonitis)  Frustrating we did not understand that the prednisone needed to go through the [facility]  Plan  - please continue prednisone as outlined at last visit although when you come to  per day  Stay at  per day. Do not go down to  per day - continue o2 at night  - start o2 [facility] with exertion  Followup  - 6-8 weeks regular clinic; will do simple walk test at followup (not )  - I think you need to still come here for followup past 05/18/18 to ILD clinic for another 1 year. Please inform the [facility] this. We will also send our note. And can call if you give [place] the correct number to call

## 2018-05-02 NOTE — Telephone Encounter (Signed)
Tina: said we need to fill out Non-VA referral form + the latest medical note  -> fax it over to [phone] . Attn Carollee Herter

## 2018-05-02 NOTE — Telephone Encounter (Signed)
Patrice, can you please help me out with where I can find a non-VA referral form so that way I can send information over to [facility] for pt to see if we can be able to have him continue to come to our office for f/u appts.

## 2018-05-17 NOTE — Telephone Encounter (Signed)
I have one. I will bring it to you. Thanks - [employer]

## 2018-05-18 NOTE — Telephone Encounter (Addendum)
Pt has been scheduled for a follow up appt with MR 6/28 at 2:30.  The non-VA care referral form has been filled out and has been faxed in attn to [facility] along with the last OV note.

## 2018-06-12 ENCOUNTER — Telehealth: Payer: Self-pay | Admitting: Internal Medicine

## 2018-06-12 NOTE — Telephone Encounter (Signed)
Will await patient's call back.  

## 2018-06-12 NOTE — Telephone Encounter (Signed)
Patient stated at the end of the call that he will call the [facility] back and try to get authorization number for us and he will call us back with that information.

## 2018-06-13 NOTE — Telephone Encounter (Signed)
Spoke with pt, he states everything has been authorized and in process. It should be approved by the time he comes in for his office visit. Nothing further is needed and I advised him to call us if he needs anything from us.

## 2018-06-15 ENCOUNTER — Encounter: Payer: Self-pay | Admitting: Internal Medicine

## 2018-06-15 ENCOUNTER — Ambulatory Visit (INDEPENDENT_AMBULATORY_CARE_PROVIDER_SITE_OTHER): Payer: No Typology Code available for payment source | Admitting: Internal Medicine

## 2018-06-15 VITALS — BP 118/72 | HR 96 | Ht 67.0 in | Wt 125.8 lb

## 2018-06-15 DIAGNOSIS — J849 Interstitial pulmonary disease, unspecified: Secondary | ICD-10-CM | POA: Diagnosis not present

## 2018-06-15 DIAGNOSIS — Z77120 Contact with and (suspected) exposure to mold (toxic): Secondary | ICD-10-CM | POA: Diagnosis not present

## 2018-06-15 DIAGNOSIS — Z7952 Long term (current) use of systemic steroids: Secondary | ICD-10-CM | POA: Diagnosis not present

## 2018-06-15 DIAGNOSIS — J679 Hypersensitivity pneumonitis due to unspecified organic dust: Secondary | ICD-10-CM | POA: Diagnosis not present

## 2018-06-15 MED ORDER — PREDNISONE 10 MG PO TABS: 10.0000 mg | ORAL_TABLET | Freq: Every day | ORAL | 3 refills | Status: AC

## 2018-06-15 NOTE — Progress Notes (Signed)
Subjective:     Patient ID: [name], male   DOB: Jul 20, 1936, 82 y.o.   MRN: [id number]  HPI  HPI   [facility]   HPI  IOV 02/20/2018  Chief Complaint  Patient presents with  . Consult    Referred by Dr. Selinda Michaels at [facility].  Pt stated he had TB in the 60's and due to that his breathing has become worse. Pt has c/o SOB at any time, dry cough, and CP.    82 year old male.  Veteran.  Referred by [facility]  He has a background history 1964 he was stationed in [place] and diagnosed with pulmonary tuberculosis and he was then admitted to a sanatorium in the [place] where he was admitted for 3 years and treated with INH and streptomycin according to his history and had cure for tuberculosis.  Since then he is always had evidence of fibrosis in his lungs on chest x-rays according to his history and is had some baseline level of shortness of breath. But he is a non-smoker without any autoimmune disease.  Then in 2017 he says he was diagnosed with a respiratory infection.  Probably admitted.  After this he said increased shortness of breath which is been a progressive decline.  The same with the cough as well.  He says he has had 3 CT scans of the chest in 2018 and is been diagnosed with unclear lung disease and treated as infection with antibiotics and prednisone.  Finally a referral was made.  Review of the chart shows that he had a CT scan of the chest in 2019 January and this shows progressive interstitial lung disease according to the report.  Also according to the report there is a classic UIP pattern.  I do not have these films with me.  Patient also tells me in the last several weeks he has lost 30 pounds and lost a lot of muscle mass.  He is extremely frustrated by this.  American College of chest physicians interstitial lung disease questionnaire is  Symptoms of cough and shortness of breath since 1964 with a turn for the worse since 2017.  Cough does wake him up at  night.  It is a dry cough.  He gets short of breath walking 100 feet after a few minutes on level ground.  In terms of his past medical history he does have some dry mouth weight loss muscle loss dysphagia and a previous history of tuberculosis although there is no any autoimmune disease.  Personal exposure history: Has not smoked any drugs of abuse or some cigarettes of marijuana.  Family history of lung disease: Denies  Home exposure history: He does not have a humidifier or insomnia or hot tub or Jacuzzi or birds or water damage or mold.  Occupational history: Denies any farm work of Financial planner or Transport planner a Ambulance person work or [employer].  Has never worked in a minor quarry of [employer] a Theatre manager of foundry or railroad a Tax inspector or smelting or Chemical engineer  Other organic dust exposure history: Denies any bird exposure to feathers of insecticide of fertilizer or beryllium or cobalt or cheese or cotton or fabric etc.  Pulmonary toxicity history: Denies.  Specifically denies any amiodarone or radiation or nitrofurantoin or BCG     Walking desaturation test on 02/20/2018 185 feet x 3 laps on ROOM AIR:  did  walk only 2 laps with CMA walked very slowly and stopped at 2 laps. Did not desaturate. Rest pulse ox was 97%, final pulse ox was 91%. HR response 104/min at rest to 112/min at peak exertion. [name]  Did nto Desaturate < 88% . [name] yes did  Desaturated </= 3% points. [name] yes did get tachyardic   OV 03/13/2018  Chief Complaint  Patient presents with  . Follow-up    PFT done today.  Pt states his breathing is about the same since last visit, becoming SOB very easily, has a dry cough, and has pain from right side of chest into his back and radiating down right arm.     FU to discuss teest results  Hx: now admits to mold expoisure at  home. Lives in trailer x 10 years. Has water seepage. Has mold in different room and on clothes and shotes. Got expiosed to piegon once in fall 2018 for 2 weeks when he went to [place] to reduce bird exposure (work related). Denies other bird expoisreu. Very worried about weight loss. KBIL shows significant wrry ad symptoms - all level 1 - 2of 7 point scale       Results for [name] (MRN [id number]) as of 03/13/2018 13:36  Ref. Range 03/13/2018 12:21  FVC-Pre Latest Units: L 1.73  FVC-%Pred-Pre Latest Units: % 48  FEV1-Pre Latest Units: L 1.26  FEV1-%Pred-Pre Latest Units: % 50  Pre FEV1/FVC ratio Latest Units: % 73  FEV1FVC-%Pred-Pre Latest Units: % 100  Results for [name] (MRN [id number]) as of 03/13/2018 13:36  Ref. Range 03/13/2018 12:21  DLCO unc Latest Units: ml/min/mmHg 9.79  DLCO unc % pred Latest Units: % 34   Results for [name] (MRN [id number]) as of 03/13/2018 13:36  Ref. Range 02/20/2018 17:01 02/20/2018 17:05  CK-MB Latest Ref Range: 0.3 - 4.0 ng/mL  5.8 (H)  Aldolase Latest Ref Range: < OR = 8.1 U/L 6.6   Sed Rate Latest Ref Range: 0 - 20 mm/hr 57 (H)   ASPERGILLUS FUMIGATUS Latest Ref Range: NEGATIVE  NEGATIVE   Pigeon Serum Latest Ref Range: NEGATIVE  POSITIVE (A)   Anit Nuclear Antibody(ANA) Latest Ref Range: NEGATIVE  NEGATIVE   Cyclic Citrullin Peptide Ab Latest Units: UNITS <16   ds DNA Ab Latest Units: IU/mL 1   Myeloperoxidase Abs Latest Units: AI <1.0   Serine Protease 3 Latest Units: AI <1.0   RA Latex Turbid. Latest Ref Range: <14 IU/mL <14   SSA (Ro) (ENA) Antibody, IgG Latest Ref Range: <1.0 NEG AI <1.0 NEG   SSB (La) (ENA) Antibody, IgG Latest Ref Range: <1.0 NEG AI 2.6 POS (A)   Scleroderma (Scl-70) (ENA) Antibody, IgG Latest Ref Range: <1.0 NEG AI <1.0 NEG      OV 04/13/2018  Chief Complaint  Patient presents with  . Acute Visit    Pt states he has had worsening breathing and has a burning sensation in rib cage on right side and has pain  in abdomen muscles. Pt also has a dry cough.   Follow-up interstitial lung disease/pulmonary fibrosis differential diagnosis of UIP/IPF versus chronic fibrotichypersensitivity pneumonitis: Mold exposure He was supposed to do a bronchoalveolar lavaget ddx n between the above etiologies but his car broke down and he could not do it but then he made this acute visit saying that the last 2 weeks his increasing shortness of breath and cough without any sputum. There is no orthopnea paroxysmal nocturnal dyspnea or edema or  fever. He is also lost weight and see last saw me. Apparently he has lost 20 or 30 pounds. It seems that is progressive respirator symptoms are correlating with this weight loss. He feels prednisone might help him. Today when we walked him he desaturated and this is a change from before.   Walking desaturation test on 04/13/2018 185 feet x 3 laps on ROOM AIR:  did YES  desaturate. Rest pulse ox was 97%, final pulse ox was 87%. HR response 99/min at rest to 112/min at peak exertion. [name]  Yes did Desaturate < 88% . [name] yes did  Desaturated </= 3% points. [name] yes get tachyardic. [name] to correct    OV 05/01/2018  Chief Complaint  Patient presents with  . Follow-up    Pt states he is currently on prednisone and that has helped with his breathing. Pt is on O2 during the day and also at night.    Follow-up interstitial lung disease with differential diagnosis of IPF versus chronic fibrotic hypersensitivity pneumonitis with significant mold exposure - started prednisone 04/29/18  This is a quick follow-up to see how he is doing with prednisone.  However because he is a [place] patient and we did not realize that his prescriptions need to go to the [place] and there was a delay with this he did not get his prednisone till 2 days ago.  He is currently has finished 2 days of 60 mg prednisone and is feeling to feel significantly better.  In fact walking  desaturation test today even though he desaturated to was 89% he did not go as low as previously.  Also his weight loss is stabilized he is now down at 117 pounds and stable there.  [facility] to be seen in the ILD clinic or outside the [place] [facility].  He is asking if he needs extended follow-up in the form of fibrosis program which I believe he definitely needs given the severity of his lung disease his weight loss and the specialist care this needs.   OV 06/15/2018  Chief Complaint  Patient presents with  . Follow-up    Pt states breathing is a lot better than it has been after the long prednisone taper. Pt does have some mild cough and occ. CP.    Follow-up interstitial lung disease with differential diagnosis of IPF versus chronic fibrotic hypersensitivity pneumonitis (more likelyu this is HP) with significant exposure of mold - started prednisone 04/29/2018    [name] is in for follow-up for interstitial lung disease but clinically started to be chronic hypersensitivity pneumonitis. Prednisone prednisone mid February 2019. This visit is to see how he is doing. He tells me that prednisone is working wonders for him. He is feeling less short of breath less cough. His energy is better. He has gained weight. He is sleeping better. Overall no problems. Walking desaturation test shows that he is less tachycardic he is desaturating less with walking. He is asking about how long he should take prednisone.   Simple office walk 185 feet x  3 laps goal with forehead probe 04/13/18 05/01/2018  06/15/2018   O2 used Room air Room air Room air  Number laps completed 3 3 3   Comments about pace x Good pace Normal pace  Resting Pulse Ox/HR 97% and 99/min 96%/104 99% and 92/min  Final Pulse Ox/HR 87% and 112/min 89%/116 93% and 108/min  Desaturated </= 88% yes Yes - almist no  Desaturated <= 3% points yes yes yes  Got Tachycardic >/= 90/min yes yes yes   Symptoms at end of test dyspnea Mild dyspnea Mild dyspnea  Miscellaneous comments Needed 2LNC to correct Mild dyspnea Improved hr and pulse ox       has no past medical history on file.   reports that he has never smoked. He has never used smokeless tobacco.  No past surgical history on file.  Allergies  Allergen Reactions  . Latex Rash    Immunization History  Administered Date(s) Administered  . Influenza, High Dose Seasonal PF 10/10/2017    No family history on file.   Current Outpatient Medications:  .  albuterol (PROAIR HFA) 108 (90 Base) MCG/ACT inhaler, Inhale 2 puffs into the lungs every 4 (four) hours as needed (FOR WHEEZING/SHORTNESS OF BREATH). , Disp: , Rfl:  .  albuterol (PROVENTIL) (2.5 MG/3ML) 0.083% nebulizer solution, Take 2.5 mg by nebulization every 6 (six) hours as needed for wheezing or shortness of breath., Disp: , Rfl:  .  benzonatate (TESSALON) 100 MG capsule, Take 100 mg by mouth 3 (three) times daily., Disp: , Rfl:  .  Bioflavonoid Products (VITAMIN C) CHEW, Chew 2 tablets by mouth daily., Disp: , Rfl:  .  finasteride (PROSCAR) 5 MG tablet, Take 5 mg by mouth daily. , Disp: , Rfl:  .  guaifenesin (HUMIBID E) 400 MG TABS tablet, Take 400 mg by mouth 3 (three) times daily., Disp: , Rfl:  .  ibuprofen (ADVIL,MOTRIN) 200 MG tablet, Take 400 mg by mouth every 8 (eight) hours as needed (for pain.)., Disp: , Rfl:  .  losartan (COZAAR) 100 MG tablet, Take 50 mg by mouth daily., Disp: , Rfl:  .  Methylsulfonylmethane (MSM) 1000 MG CAPS, Take 1,000 mg by mouth daily., Disp: , Rfl:  .  Multiple Vitamin (MULTIVITAMIN WITH MINERALS) TABS tablet, Take 1 tablet by mouth daily. CELLULAR ENERGY IMMUNE SUPPORT & MUSCULAR SUPPORT MULTIVITAMIN, Disp: , Rfl:  .  omeprazole (PRILOSEC) 20 MG capsule, Take 20 mg by mouth 3 (three) times daily before meals. , Disp: , Rfl:  .  predniSONE (DELTASONE) 10 MG tablet, Take 1 tablet (10 mg total) by mouth daily with breakfast., Disp:  30 tablet, Rfl: 0 .  tiotropium (SPIRIVA) 18 MCG inhalation capsule, Place 18 mcg into inhaler and inhale 2 (two) times daily as needed (SCHEDULED EACH MORNING). , Disp: , Rfl:      Review of Systems     Objective:   Physical Exam  Constitutional: He is oriented to person, place, and time. He appears well-developed and well-nourished. No distress.  Body mass index is 19.7 kg/m. Thin but has gained weight  HENT:  Head: Normocephalic and atraumatic.  Right Ear: External ear normal.  Left Ear: External ear normal.  Mouth/Throat: Oropharynx is clear and moist. No oropharyngeal exudate.  Eyes: Pupils are equal, round, and reactive to light. Conjunctivae and EOM are normal. Right eye exhibits no discharge. Left eye exhibits no discharge. No scleral icterus.  Neck: Normal range of motion. Neck supple. No JVD present. No tracheal deviation present. No thyromegaly present.  Cardiovascular: Normal rate, regular rhythm and intact distal pulses. Exam reveals no gallop and no friction rub.  No murmur heard. Pulmonary/Chest: Effort normal and breath sounds normal. No respiratory distress. He has no wheezes. He has no rales. He exhibits no tenderness.  Crackles +  Abdominal: Soft. Bowel sounds are normal. He exhibits no  distension and no mass. There is no tenderness. There is no rebound and no guarding.  Musculoskeletal: Normal range of motion. He exhibits no edema or tenderness.  Lean muscle mass +  Lymphadenopathy:    He has no cervical adenopathy.  Neurological: He is alert and oriented to person, place, and time. He has normal reflexes. No cranial nerve deficit. Coordination normal.  Skin: Skin is warm and dry. No rash noted. He is not diaphoretic. No erythema. No pallor.  Psychiatric: He has a normal mood and affect. His behavior is normal. Judgment and thought content normal.  Nursing note and vitals reviewed.  Today's Vitals   06/15/18 1422  BP: 118/72  Pulse: 96  SpO2: 95%  Weight:  125 lb 12.8 oz (57.1 kg)  Height: 5\' 7"  (1.702 m)    Estimated body mass index is 19.7 kg/m as calculated from the following:   Height as of this encounter: 5\' 7"  (1.702 m).   Weight as of this encounter: 125 lb 12.8 oz (57.1 kg).     Assessment:       ICD-10-CM   1. Hypersensitivity pneumonitis (HCC) J67.9   2. ILD (interstitial lung disease) (HCC) J84.9   3. Mold exposure Z77.120   4. 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 The prednisone is helping you significantly  Plan - Continue prednisone 10 mg per day ; CMA to [facility] - Support handicap placard for you - Continue oxygen at night and with extreme exertion - Get rid of the mold in the house with a professional cleaning agency  - Will not be in the house at the time of this cleaning -  [facility] about protecting your bone health in terms of prednisone  Follow-up - In 3 months do spirometry and DLCO - REturn to see Dr Marchelle Gearing in ILD clinic in 3 months     > 50% of this > 25 min visit spent in face to face counseling or coordination of care - by this undersigned MD - Dr Kalman Shan. This includes one or more of the following documented above: discussion of test results, diagnostic or treatment recommendations, prognosis, risks and benefits of management options, instructions, education, compliance or risk-factor reduction    Dr. Kalman Shan, M.D., [facility].C.P Pulmonary and Critical Care Medicine Staff Physician, [facility] Center Director - Interstitial Lung Disease  Program  Pulmonary Fibrosis [facility] at [facility] [place], [place], [address]  Pager: [phone], If no answer or between   15:00h - 7:00h: call [phone] Telephone: [phone]

## 2018-06-15 NOTE — Patient Instructions (Addendum)
ICD-10-CM   1. Hypersensitivity pneumonitis (HCC) J67.9   2. ILD (interstitial lung disease) (HCC) J84.9   3. Mold exposure Z77.120   4. 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

## 2018-07-17 ENCOUNTER — Telehealth: Payer: Self-pay | Admitting: Internal Medicine

## 2018-07-17 NOTE — Telephone Encounter (Signed)
[place] [facility] Carol[facility] who stated she was needing pt's last OV from our office faxed in attn to her.  [facility] Nothing further needed.

## 2018-09-11 ENCOUNTER — Ambulatory Visit (INDEPENDENT_AMBULATORY_CARE_PROVIDER_SITE_OTHER): Payer: No Typology Code available for payment source | Admitting: Internal Medicine

## 2018-09-11 ENCOUNTER — Encounter: Payer: Self-pay | Admitting: Internal Medicine

## 2018-09-11 VITALS — BP 122/64 | HR 92 | Ht 67.0 in | Wt 131.0 lb

## 2018-09-11 DIAGNOSIS — Z23 Encounter for immunization: Secondary | ICD-10-CM | POA: Diagnosis not present

## 2018-09-11 DIAGNOSIS — Z77120 Contact with and (suspected) exposure to mold (toxic): Secondary | ICD-10-CM | POA: Diagnosis not present

## 2018-09-11 DIAGNOSIS — J849 Interstitial pulmonary disease, unspecified: Secondary | ICD-10-CM

## 2018-09-11 DIAGNOSIS — Z7952 Long term (current) use of systemic steroids: Secondary | ICD-10-CM

## 2018-09-11 DIAGNOSIS — J679 Hypersensitivity pneumonitis due to unspecified organic dust: Secondary | ICD-10-CM

## 2018-09-11 LAB — PULMONARY FUNCTION TEST
DL/VA % PRED: 62 %
DL/VA: 2.74 ml/min/mmHg/L
DLCO UNC: 12.5 ml/min/mmHg
DLCO unc % pred: 44 %
FEF 25-75 Pre: 0.88 L/sec
FEF2575-%Pred-Pre: 52 %
FEV1-%Pred-Pre: 63 %
FEV1-Pre: 1.58 L
FEV1FVC-%Pred-Pre: 98 %
FEV6-%Pred-Pre: 69 %
FEV6-PRE: 2.25 L
FEV6FVC-%Pred-Pre: 107 %
FVC-%PRED-PRE: 64 %
FVC-Pre: 2.26 L
PRE FEV6/FVC RATIO: 100 %
Pre FEV1/FVC ratio: 70 %

## 2018-09-11 NOTE — Progress Notes (Signed)
Spirometry and Dlco done today. 

## 2018-09-11 NOTE — Patient Instructions (Addendum)
ICD-10-CM   1. Hypersensitivity pneumonitis (HCC) J67.9   2. ILD (interstitial lung disease) (HCC) J84.9   3. Mold exposure Z77.120   4. Long term (current) use of systemic steroids Z79.52     Lung function is significantly better from 48% to 64% in 6 months because of prednisone and getting rid of mold in the house  Plan -Prevnar vaccine today September 11, 2018 -Flu shot at your earliest possibility [deferred today] -Continue prednisone 10 mg/day (we discussed dropping down the dose but he want to defer this till next visit] -Get bone density and bone health monitoring through your primary care physician -In 6 months do high-resolution CT chest -In 6 months do spirometry and DLCO  Follow-up -Return in 6 months to the ILD clinic but after CT scan of the chest and lung function test  -Note new location [address] [place] [id number]

## 2018-09-11 NOTE — Progress Notes (Signed)
[facility]   [facility]PI  IOV 02/20/2018  Chief Complaint  Patient presents with  . Consult    Referred by Dr. Selinda Michaels at [facility].  Pt stated he had TB in the 60's and due to that his breathing has become worse. Pt has c/o SOB at any time, dry cough, and CP.    82 year old male.  Veteran.  Referred by [facility].  He has a background history 1964 he was stationed in [place] and diagnosed with pulmonary tuberculosis and he was then admitted to a sanatorium in the [place] where he was admitted for 3 years and treated with INH and streptomycin according to his history and had cure for tuberculosis.  Since then he is always had evidence of fibrosis in his lungs on chest x-rays according to his history and is had some baseline level of shortness of breath. But he is a non-smoker without any autoimmune disease.  Then in 2017 he says he was diagnosed with a respiratory infection.  Probably admitted.  After this he said increased shortness of breath which is been a progressive decline.  The same with the cough as well.  He says he has had 3 CT scans of the chest in 2018 and is been diagnosed with unclear lung disease and treated as infection with antibiotics and prednisone.  Finally a referral was made.  Review of the chart shows that he had a CT scan of the chest in 2019 January and this shows progressive interstitial lung disease according to the report.  Also according to the report there is a classic UIP pattern.  I do not have these films with me.  Patient also tells me in the last several weeks he has lost 30 pounds and lost a lot of muscle mass.  He is extremely frustrated by this.  American College of chest physicians interstitial lung disease questionnaire is  Symptoms of cough and shortness of breath since 1964 with a turn for the worse since 2017.  Cough does wake him up at night.  It is a dry cough.  He gets short of breath walking 100 feet after a few minutes on level  ground.  In terms of his past medical history he does have some dry mouth weight loss muscle loss dysphagia and a previous history of tuberculosis although there is no any autoimmune disease.  Personal exposure history: Has not smoked any drugs of abuse or some cigarettes of marijuana.  Family history of lung disease: Denies  Home exposure history: He does not have a humidifier or insomnia or hot tub or Jacuzzi or birds or water damage or mold.  Occupational history: Denies any farm work of Financial planner or Transport planner a Ambulance person work or [employer].  Has never worked in a minor quarry of [employer] a Theatre manager of foundry or railroad a Tax inspector or smelting or Chemical engineer  Other organic dust exposure history: Denies any bird exposure to feathers of insecticide of fertilizer or beryllium or cobalt or cheese or cotton or fabric etc.  Pulmonary toxicity history: Denies.  Specifically denies any amiodarone or radiation or nitrofurantoin or BCG     Walking desaturation test on 02/20/2018 185 feet x 3 laps on ROOM AIR:  did walk only 2 laps with CMA walked very slowly and stopped at 2 laps. Did not desaturate. Rest pulse ox was 97%, final  pulse ox was 91%. HR response 104/min at rest to 112/min at peak exertion. [name]  Did nto Desaturate < 88% . Jerret Pamintuan yes did  Desaturated </= 3% points. Dalin Dalal yes did get tachyardic   OV 03/13/2018  Chief Complaint  Patient presents with  . Follow-up    PFT done today.  Pt states his breathing is about the same since last visit, becoming SOB very easily, has a dry cough, and has pain from right side of chest into his back and radiating down right arm.     FU to discuss teest results  Hx: now admits to mold expoisure at home. Lives in trailer x 10 years. Has water seepage. Has mold in different room and on clothes and  shotes. Got expiosed to piegon once in fall 2018 for 2 weeks when he went to a[place] to reduce bird exposure (work related). Denies other bird expoisreu. Very worried about weight loss. KBIL shows significant wrry ad symptoms - all level 1 - 2of 7 point scale       Results for ISHMEAL, RORIE (MRN [id number]) as of 03/13/2018 13:36  Ref. Range 03/13/2018 12:21  FVC-Pre Latest Units: L 1.73  FVC-%Pred-Pre Latest Units: % 48  FEV1-Pre Latest Units: L 1.26  FEV1-%Pred-Pre Latest Units: % 50  Pre FEV1/FVC ratio Latest Units: % 73  FEV1FVC-%Pred-Pre Latest Units: % 100  Results for JYMIR, DUNAJ (MRN [id number]) as of 03/13/2018 13:36  Ref. Range 03/13/2018 12:21  DLCO unc Latest Units: ml/min/mmHg 9.79  DLCO unc % pred Latest Units: % 34   Results for DERON, POOLE (MRN [id number]) as of 03/13/2018 13:36  Ref. Range 02/20/2018 17:01 02/20/2018 17:05  CK-MB Latest Ref Range: 0.3 - 4.0 ng/mL  5.8 (H)  Aldolase Latest Ref Range: < OR = 8.1 U/L 6.6   Sed Rate Latest Ref Range: 0 - 20 mm/hr 57 (H)   ASPERGILLUS FUMIGATUS Latest Ref Range: NEGATIVE  NEGATIVE   Pigeon Serum Latest Ref Range: NEGATIVE  POSITIVE (A)   Anit Nuclear Antibody(ANA) Latest Ref Range: NEGATIVE  NEGATIVE   Cyclic Citrullin Peptide Ab Latest Units: UNITS <16   ds DNA Ab Latest Units: IU/mL 1   Myeloperoxidase Abs Latest Units: AI <1.0   Serine Protease 3 Latest Units: AI <1.0   RA Latex Turbid. Latest Ref Range: <14 IU/mL <14   SSA (Ro) (ENA) Antibody, IgG Latest Ref Range: <1.0 NEG AI <1.0 NEG   SSB (La) (ENA) Antibody, IgG Latest Ref Range: <1.0 NEG AI 2.6 POS (A)   Scleroderma (Scl-70) (ENA) Antibody, IgG Latest Ref Range: <1.0 NEG AI <1.0 NEG      OV 04/13/2018  Chief Complaint  Patient presents with  . Acute Visit    Pt states he has had worsening breathing and has a burning sensation in rib cage on right side and has pain in abdomen muscles. Pt also has a dry cough.   Follow-up interstitial lung disease/pulmonary  fibrosis differential diagnosis of UIP/IPF versus chronic fibrotichypersensitivity pneumonitis: Mold exposure He was supposed to do a bronchoalveolar lavaget ddx n between the above etiologies but his car broke down and he could not do it but then he made this acute visit saying that the last 2 weeks his increasing shortness of breath and cough without any sputum. There is no orthopnea paroxysmal nocturnal dyspnea or edema or fever. He is also lost weight and see last saw me. Apparently he has lost 20 or 30 pounds. It seems that is  progressive respirator symptoms are correlating with this weight loss. He feels prednisone might help him. Today when we walked him he desaturated and this is a change from before.   Walking desaturation test on 04/13/2018 185 feet x 3 laps on ROOM AIR:  did YES  desaturate. Rest pulse ox was 97%, final pulse ox was 87%. HR response 99/min at rest to 112/min at peak exertion. Patient [name] DarnerJohn Henriques  Yes did Desaturate < 88% . Orlando Perriello yes did  Desaturated </= 3% points. Abijah Uzelac yes get tachyardic. Neeeded 2L [place] to correct    OV 05/01/2018  Chief Complaint  Patient presents with  . Follow-up    Pt states he is currently on prednisone and that has helped with his breathing. Pt is on O2 during the day and also at night.    Follow-up interstitial lung disease with differential diagnosis of IPF versus chronic fibrotic hypersensitivity pneumonitis with significant mold exposure - started prednisone 04/29/18  This is a quick follow-up to see how he is doing with prednisone.  However because [facility] and we did not realize that his [facility] need to [facility] and there was a delay with this he did not get his prednisone till 2 days ago.  He is currently has finished 2 days of 60 mg prednisone and is feeling to feel significantly better.  In fact walking desaturation test today even though he desaturated to was 89% he did not go as low as previously.  Also  his weight loss is stabilized he is now down at 117 pounds and stable there.  Of note, [facility] till May [facility], 2018 to be seen in the [facility] clinic or outside [facility].  He is asking if he needs extended follow-up in the form of fibrosis program which I believe he definitely needs given the severity of his lung disease his weight loss and the specialist care this needs.   OV 06/15/2018  Chief Complaint  Patient presents with  . Follow-up    Pt states breathing is a lot better than it has been after the long prednisone taper. Pt does have some mild cough and occ. CP.    Follow-up interstitial lung disease with differential diagnosis of IPF versus chronic fibrotic hypersensitivity pneumonitis (more likelyu this is HP) with significant exposure of mold - started prednisone 04/29/2018    [name] DarnerJohn Bartley is in for follow-up for interstitial lung disease but clinically stated to be chronic hypersensitivity pneumonitis. Prednisone prednisone mid February 2019. This visit is to see how he is doing. He tells me that prednisone is working wonders for him. He is feeling less short of breath less cough. His energy is better. He has gained weight. He is sleeping better. Overall no problems. Walking desaturation test shows that he is less tachycardic he is desaturating less with walking. He is asking about how long he should take prednisone.     OV 09/11/2018  Subjective:  Patient ID: [name] DarnerJohn Bessler, male , DOB: 17-Sep-1936 , age [age] y.o. , MRN: [id number] , ADDRESS: [address] [place] [id number]   09/11/2018 -   Chief Complaint  Patient presents with  . Follow-up    PFT performed today.  Pt states he has been doing well and states he has even gained some weight back.      HPI [name] DarnerJohn Shiffler 82 y.o. -interstitial lung disease due to clinical diagnosis of chronic  hypersensitivity pneumonitis.  Last visit was in June 2019.  At that time we opted to continue prednisone  10 mg/day given subjective improvement and improvement in walking desaturation test.  At this point in time he still continues 10 mg/day of prednisone.  He has gained weight.  He says the shortness of breath and cough are improved.  His lung function does indeed show significant improvement as shown below.  His walking desaturation test heart rate response and pulse ox response is slightly better.  He does not want to drop the dose of prednisone.  We discussed side effects with bone health in the setting of chronic prednisone.  He is aware of this.  He tells me that the [facility] primary care physician assistant has potentially deferred bone health testing/recommendation to me.  I stated to him that I can only make a recommendation strongly.  He will take the recommendation to the physician assistant.  He is here to have his flu shot but we are out of stock.  He will have it with primary care.  However he will have a Prevnar vaccine today   Results for MAMIE, DIIORIO (MRN [id number]) as of 09/11/2018 14:07  Ref. Range 03/13/2018 12:21 09/11/2018 13:13  FVC-Pre Latest Units: L 1.73 2.26  FVC-%Pred-Pre Latest Units: % 48 64   Results for TAMIR, WALLMAN (MRN [id number]) as of 09/11/2018 14:07  Ref. Range 03/13/2018 12:21 09/11/2018 13:13  DLCO unc Latest Units: ml/min/mmHg 9.79 12.50  DLCO unc % pred Latest Units: % 34 44    Simple office walk 185 feet x  3 laps goal with forehead probe 04/13/18 05/01/2018  06/15/2018  09/11/2018   O2 used Room air Room air Room air Room air  Number laps completed 3 3 3 3   Comments about pace x Good pace Normal pace Mod pace  Resting Pulse Ox/HR 97% and 99/min 96%/104 99% and 92/min 99% and 92/min  Final Pulse Ox/HR 87% and 112/min 89%/116 93% and 108/min 94% and 106.min  Desaturated </= 88% yes Yes - almist no no  Desaturated <= 3% points yes yes yes yes  Got Tachycardic >/= 90/min yes yes yes yes  Symptoms at end of test dyspnea Mild dyspnea Mild dyspnea Mild dyspnea    Miscellaneous comments Needed 2L[place] to correct Mild dyspnea Improved hr and pulse ox Improving hr/pulse ox    ROS - per HPI     has no past medical history on file.   reports that he has never smoked. He has never used smokeless tobacco.  No past surgical history on file.  Allergies  Allergen Reactions  . Latex Rash    Immunization History  Administered Date(s) Administered  . Influenza, High Dose Seasonal PF 10/10/2017    No family history on file.   Current Outpatient Medications:  .  albuterol (PROAIR HFA) 108 (90 Base) MCG/ACT inhaler, Inhale 2 puffs into the lungs every 4 (four) hours as needed (FOR WHEEZING/SHORTNESS OF BREATH). , Disp: , Rfl:  .  albuterol (PROVENTIL) (2.5 MG/3ML) 0.083% nebulizer solution, Take 2.5 mg by nebulization every 6 (six) hours as needed for wheezing or shortness of breath., Disp: , Rfl:  .  benzonatate (TESSALON) 100 MG capsule, Take 100 mg by mouth 3 (three) times daily., Disp: , Rfl:  .  Bioflavonoid Products (VITAMIN C) CHEW, Chew 2 tablets by mouth daily., Disp: , Rfl:  .  finasteride (PROSCAR) 5 MG tablet, Take 5 mg by mouth daily. , Disp: , Rfl:  .  guaifenesin (  HUMIBID E) 400 MG TABS tablet, Take 400 mg by mouth 3 (three) times daily., Disp: , Rfl:  .  ibuprofen (ADVIL,MOTRIN) 200 MG tablet, Take 400 mg by mouth every 8 (eight) hours as needed (for pain.)., Disp: , Rfl:  .  losartan (COZAAR) 100 MG tablet, Take 50 mg by mouth daily., Disp: , Rfl:  .  Methylsulfonylmethane (MSM) 1000 MG CAPS, Take 1,000 mg by mouth daily., Disp: , Rfl:  .  Multiple Vitamin (MULTIVITAMIN WITH MINERALS) TABS tablet, Take 1 tablet by mouth daily. CELLULAR ENERGY IMMUNE SUPPORT & MUSCULAR SUPPORT MULTIVITAMIN, Disp: , Rfl:  .  omeprazole (PRILOSEC) 20 MG capsule, Take 20 mg by mouth 3 (three) times daily before meals. , Disp: , Rfl:  .  predniSONE (DELTASONE) 10 MG tablet, Take 1 tablet (10 mg total) by mouth daily with breakfast., Disp: 90 tablet, Rfl:  3 .  tiotropium (SPIRIVA) 18 MCG inhalation capsule, Place 18 mcg into inhaler and inhale 2 (two) times daily as needed (SCHEDULED EACH MORNING). , Disp: , Rfl:       Objective:   Vitals:   09/11/18 1348  BP: 122/64  Pulse: 92  SpO2: 99%  Weight: 131 lb (59.4 kg)  Height: 5\' 7"  (1.702 m)    Estimated body mass index is 20.52 kg/m as calculated from the following:   Height as of this encounter: 5\' 7"  (1.702 m).   Weight as of this encounter: 131 lb (59.4 kg).  @WEIGHTCHANGE @  [employer]   09/11/18 1348  Weight: 131 lb (59.4 kg)     Physical Exam  General Appearance:    Alert, cooperative, no distress, appears stated age - yes , sitting on - yes , Deconditioned looking - yes .LEAN  Head:    Normocephalic, without obvious abnormality, atraumatic  Eyes:    PERRL, conjunctiva/corneas clear,  Ears:    Normal TM's and external ear canals, both ears  Nose:   Nares normal, septum midline, mucosa normal, no drainage    or sinus tenderness. OXYGEN ON  - no . Patient is @ room air   Throat:   Lips, mucosa, and tongue normal; teeth and gums normal. Cyanosis on lips - no  Neck:   Supple, symmetrical, trachea midline, no adenopathy;    thyroid:  no enlargement/tenderness/nodules; no carotid   bruit or JVD  Back:     Symmetric, no curvature, ROM normal, no CVA tenderness  Lungs:     Distress - no , Wheeze no, Barrell Chest - no, Purse lip breathing - no, Crackles - YESm DIFFUSE , SCATTERED    Chest Wall:    No tenderness or deformity. Scars in chest no   Heart:    Regular rate and rhythm, S1 and S2 normal, no rub   or gallop, Murmur - no  Breast Exam:    NOT DONE  Abdomen:     Soft, non-tender, bowel sounds active all four quadrants,    no masses, no organomegaly. Visceral obesity - no  Genitalia:   NOT DONE  Rectal:   NOT DONE  Extremities:   Extremities normal, atraumatic, Clubbing - maybe mild, Edema - no  Pulses:   2+ and symmetric all extremities  Skin:   Stigmata of  Connective Tissue Disease - no  Lymph nodes:   Cervical, supraclavicular, and axillary nodes normal  Psychiatric:  Neurologic:    - pleasant  CAm-ICU - neg, Alert and Oriented x 3 - yes, Moves all 4s - yes, Speech - normal,  Cognition - intact           Assessment:       ICD-10-CM   1. Hypersensitivity pneumonitis (HCC) J67.9   2. ILD (interstitial lung disease) (HCC) J84.9   3. Mold exposure Z77.120   4. Long term (current) use of systemic steroids Z79.52        Plan:     Patient Instructions     ICD-10-CM   1. Hypersensitivity pneumonitis (HCC) J67.9   2. ILD (interstitial lung disease) (HCC) J84.9   3. Mold exposure Z77.120   4. Long term (current) use of systemic steroids Z79.52     Lung function is significantly better from 48% to 64% in 6 months because of prednisone and getting rid of mold in the house  Plan -Prevnar vaccine today September 11, 2018 -Flu shot at your earliest possibility [deferred today] -Continue prednisone 10 mg/day (we discussed dropping down the dose but he want to defer this till next visit] -Get bone density and bone health monitoring through your primary care physician -In 6 months do high-resolution CT chest -In 6 months do spirometry and DLCO  Follow-up -Return in 6 months to the ILD clinic but after CT scan of the chest and lung function test  -Note new location [address], [place], [address]   > 50% of this > 25 min visit spent in face to face counseling or coordination of care - by this undersigned MD - Dr Kalman Shan. This includes one or more of the following documented above: discussion of test results, diagnostic or treatment recommendations, prognosis, risks and benefits of management options, instructions, education, compliance or risk-factor reduction   SIGNATURE    Dr. Kalman Shan, M.D., F.C.C.P,  Pulmonary and Critical Care Medicine Staff Physician, [facility] Center Director - Interstitial  Lung Disease  Program  Pulmonary Fibrosis [facility] Network at [facility] [place], [place], [address]  Pager: [phone], If no answer or between  15:00h - 7:00h: call [phone] Telephone: [phone]  2:27 PM 09/11/2018

## 2018-09-26 ENCOUNTER — Telehealth: Payer: Self-pay | Admitting: Internal Medicine

## 2018-09-26 NOTE — Telephone Encounter (Signed)
Called and spoke to pt, who is requesting that last AVS be faxed to his [employer]. I have suggested that we mail  AVS to his address, as an AVS obtains demographics. Pt stated that it would be okay to mail AVS to address on file- verified with pt.  AVS has been printed and placed in outgoing mail.  Nothing further is needed.

## 2019-02-12 ENCOUNTER — Telehealth: Payer: Self-pay | Admitting: Internal Medicine

## 2019-02-12 NOTE — Telephone Encounter (Signed)
ATC pt, no answer. Left message for pt to call back.  

## 2019-02-13 NOTE — Telephone Encounter (Signed)
Pt is calling back [phone]

## 2019-02-13 NOTE — Telephone Encounter (Signed)
Pt called me back but I dont need him as of yet looks like it should have been triage [phone]

## 2019-02-13 NOTE — Telephone Encounter (Signed)
Left message informing patient one of the PCC"s would be calling him to get that scheduled closer to the expected date of 03/12/19. Nothing further is needed at this time.

## 2019-02-19 ENCOUNTER — Telehealth: Payer: Self-pay | Admitting: Internal Medicine

## 2019-02-19 NOTE — Telephone Encounter (Signed)
I believe the question was does he still need a CT because he had 2 done at the [place] when he was in the hospital in Dec. I tried to call pt to see if he could get the reports or discs so MR could review them. MR I dont know if this enough information for you to make that decision. I will await return call from pt.    Otho Darner  to [name]      4:23 PM  pt called me back about his ct pt stated he was in the hosp in Dec at the [place] and he had 2 chest ct's while he was there does he still need to do this one for March

## 2019-02-19 NOTE — Telephone Encounter (Signed)
If he can get the disc of the CT from [place] will be great. [facility] never sends [place] the disc when we send them signed release. But if he cannot get the disc then he needs CT here

## 2019-02-20 NOTE — Telephone Encounter (Signed)
Called patient, unable to reach. Left message to give us a call back.  

## 2019-02-21 NOTE — Telephone Encounter (Signed)
Called patient unable to reach left message to give [place] a a call back.

## 2019-02-22 NOTE — Telephone Encounter (Signed)
Called and spoke with Patient.  Patient stated that he was released from [facility] hospital yesterday, 02/21/19. Patient stated that he had CT in 10/2018 and has had a recent one 02/2019.  [facility] stated [facility] and [facility] and recent CXR sent to Dr Marchelle Gearing.  Patient stated that he would call back if he had any problems contacting [facility] for [facility]. Nothing further at this time.

## 2019-03-15 ENCOUNTER — Telehealth: Payer: Self-pay | Admitting: Internal Medicine

## 2019-03-15 ENCOUNTER — Telehealth: Payer: Self-pay | Admitting: *Deleted

## 2019-03-15 NOTE — Telephone Encounter (Signed)
Yes please tell him to hunker down and maintain physical distancing. Telll him to Listen to Dr Karen Kays on the news and no one else. Please change appt to 4 months. Please tell him I do not see this being over in 2 months if govt starts easing restrictions. Please tell him depth of darkness in [place] is still a month away  ROV 4 months -

## 2019-03-15 NOTE — Telephone Encounter (Signed)
FYI: Called to reschedule Spiro/Dlco and ov on 4/14 with MR. Pt was ok with that b/c off COVID and he has not been able to get CD from [place]. I told him MR would order a CT if he couldn't get results. Pt wants to wait until July and or this virus has cleared. [facility] and [facility] can't [facility] there.

## 2019-03-15 NOTE — Telephone Encounter (Signed)
Returned call to patient and made him aware that MR would like to see him back in 4 months. As well as whom to listen to in regards to [place] virus. Pt verbalized understanding. Nothing further needed.

## 2019-03-15 NOTE — Telephone Encounter (Signed)
Called and spoke with patient regarding MR recommendations Pt states that all appts are in rescheduled for July 2020 Pt states that [facility] has spoke with [facility] and could not go there due to covid19 lock down He has informed the [facility] to send over all CXR and records to our office fax [phone] Pt verbalized and expressed understanding, had no further concerns Nothing further needed.

## 2019-04-02 ENCOUNTER — Ambulatory Visit: Payer: No Typology Code available for payment source | Admitting: Internal Medicine

## 2019-05-30 IMAGING — CT CT OUTSIDE FILMS CHEST
2 of 5 series · 16 of 36 positions shown, 20 images · non-contrast
Comparison: none

[Series 5: recon 4: without contrast · axial · non-contrast · 0.68mm/px · z∈[-256,+0]mm · 13 of 297 slices shown, 17 images]
[im 22/297  mediastinal]
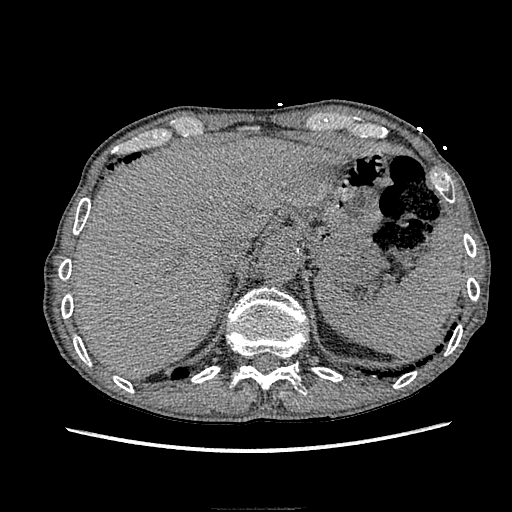
[im 22/297  lung]
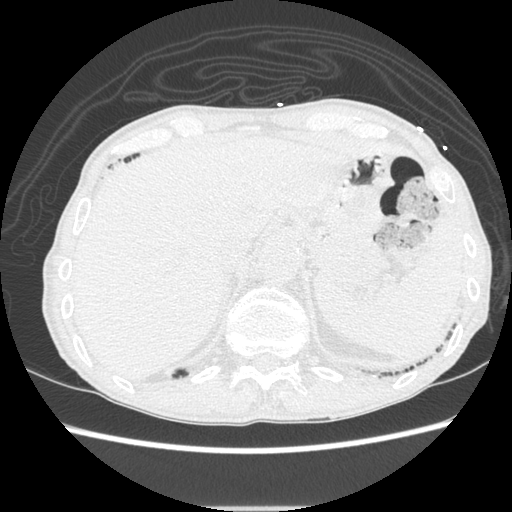
[im 43/297  lung]
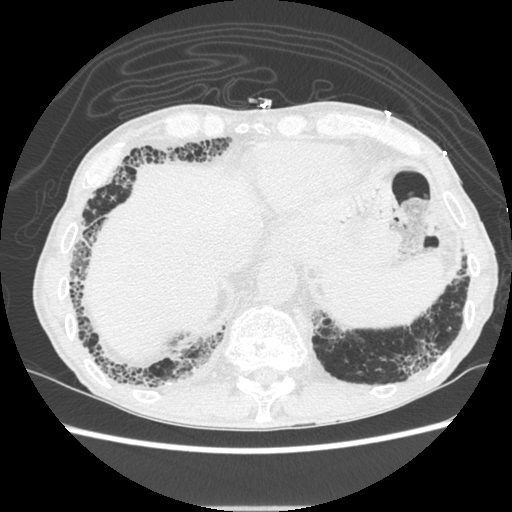
[im 64/297  lung]
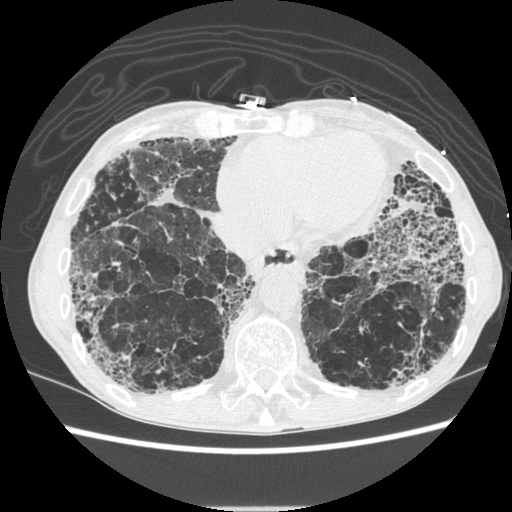
[im 85/297  lung]
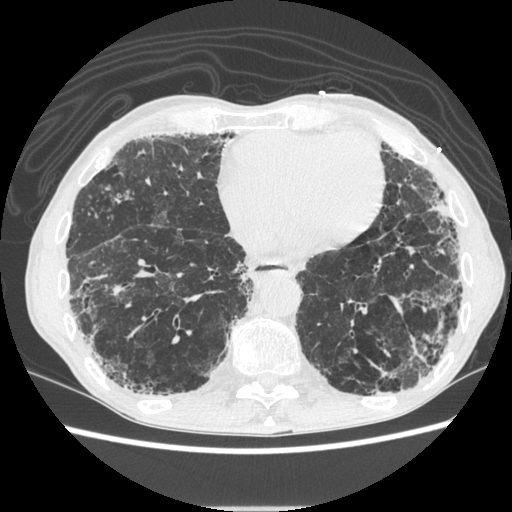
[im 106/297  mediastinal]
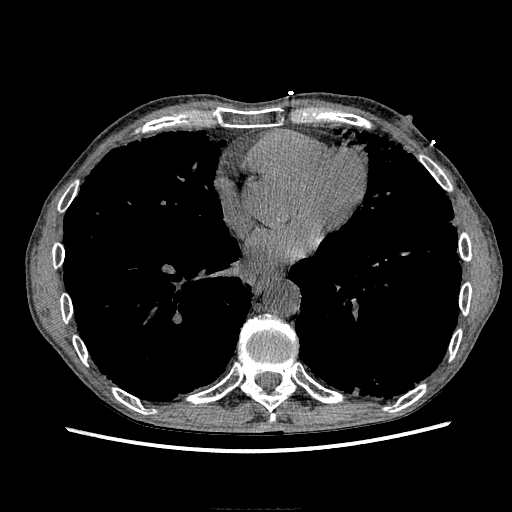
[im 106/297  lung]
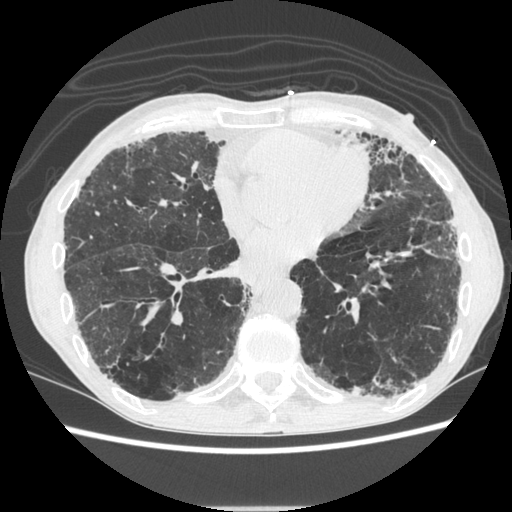
[im 127/297  lung]
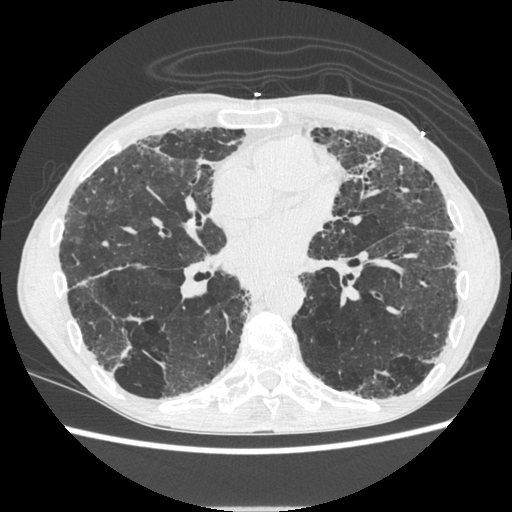
[im 149/297  lung]
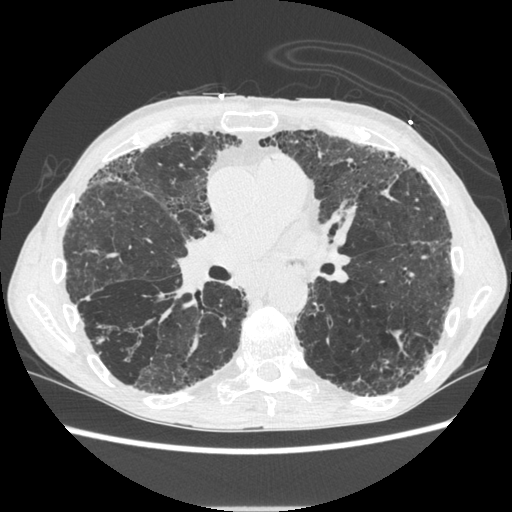
[im 170/297  lung]
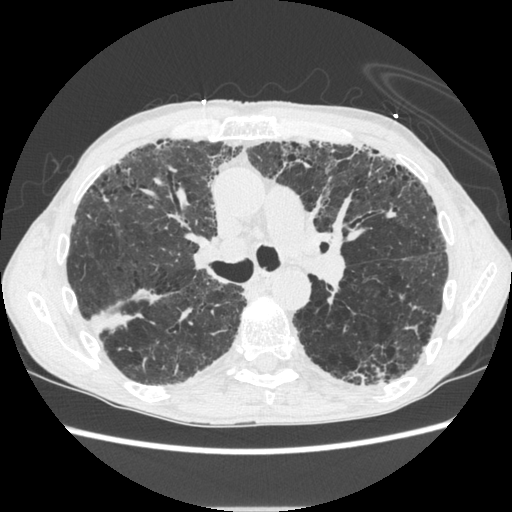
[im 191/297  mediastinal]
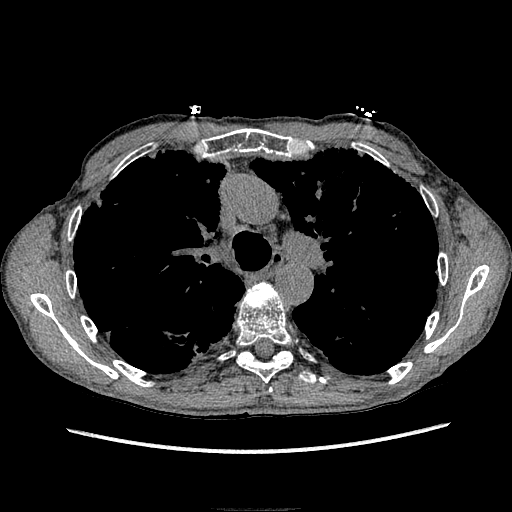
[im 191/297  lung]
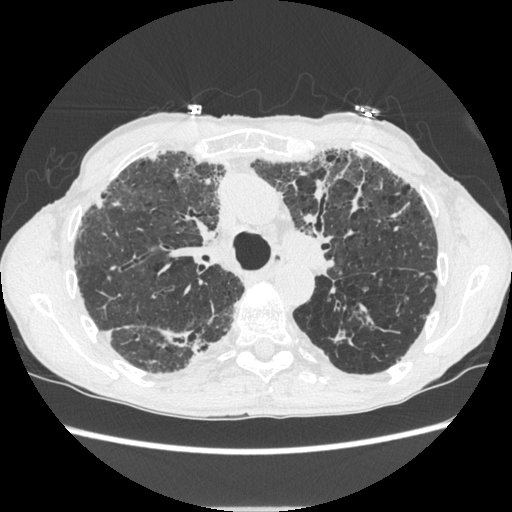
[im 212/297  lung]
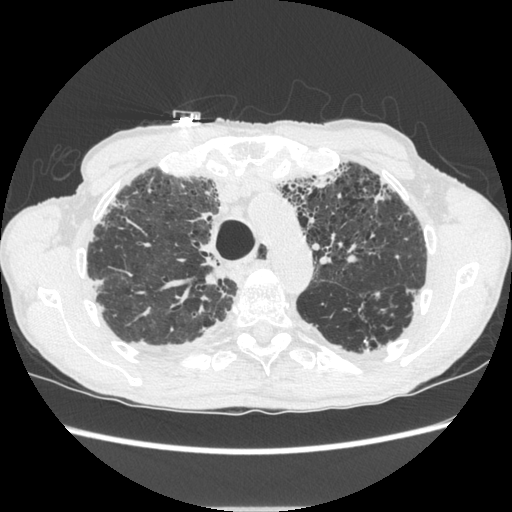
[im 233/297  lung]
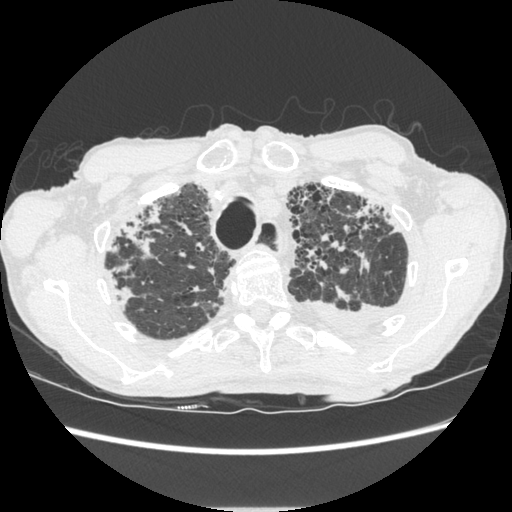
[im 254/297  lung]
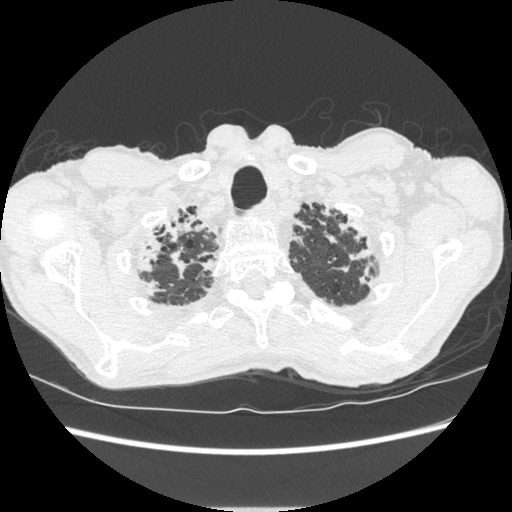
[im 275/297  mediastinal]
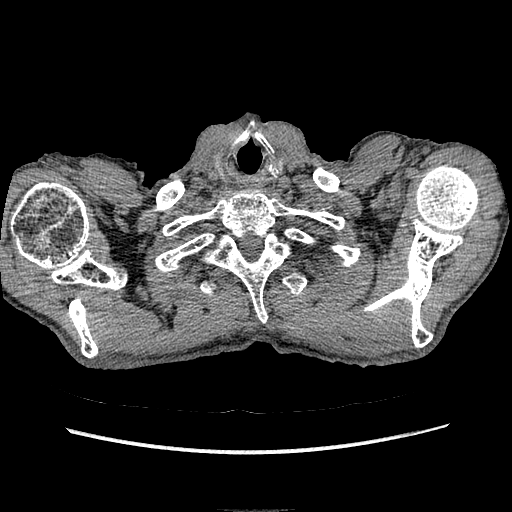
[im 275/297  lung]
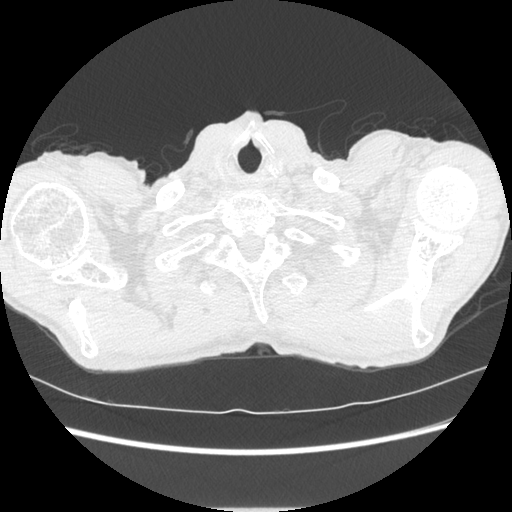

[Series 601: sagittal 2x2 · sagittal · 0.68mm/px · 3 of 176 slices shown]
[im 36/176  lung]
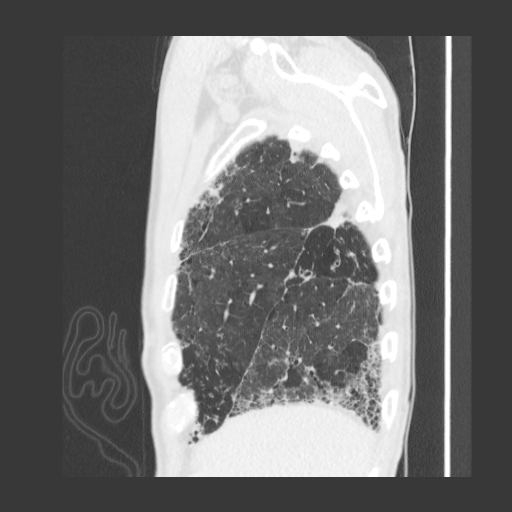
[im 71/176  lung]
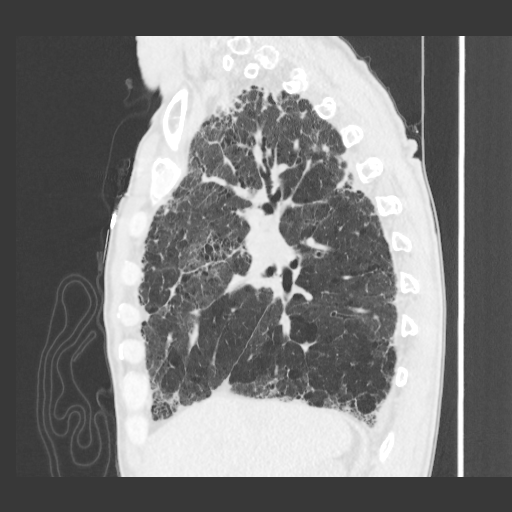
[im 106/176  lung]
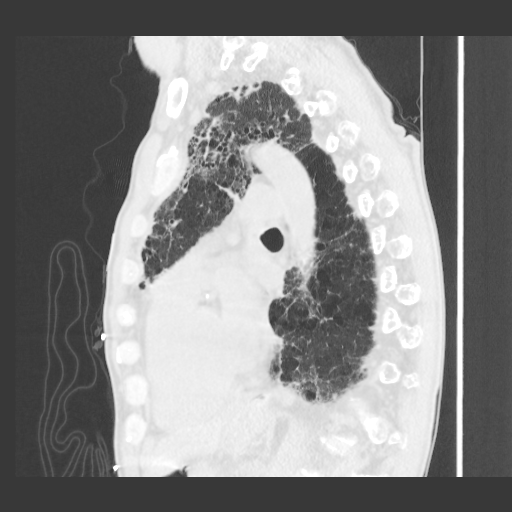

[16 of 36 positions shown; findings below may reference images not displayed]

Canned report from images found in remote index.

Refer to host system for actual result text.
